# Patient Record
Sex: Female | Born: 1990 | Race: White | Hispanic: No | Marital: Single | Smoking: Former smoker
Health system: Southern US, Community
[De-identification: ages and names within clinical notes are randomized; demographics above are authoritative.]

## PROBLEM LIST (undated history)

## (undated) DIAGNOSIS — Z789 Other specified health status: Secondary | ICD-10-CM

## (undated) HISTORY — PX: TONSILLECTOMY: SUR1361

## (undated) HISTORY — PX: NO PAST SURGERIES: SHX2092

## (undated) HISTORY — DX: Other specified health status: Z78.9

---
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
## 2015-02-08 ENCOUNTER — Emergency Department (HOSPITAL_COMMUNITY)
Admission: EM | Admit: 2015-02-08 | Discharge: 2015-02-08 | Disposition: A | Discharge status: Home / Self Care | Payer: 59 | Attending: Emergency Medicine | Admitting: Emergency Medicine

## 2015-02-08 ENCOUNTER — Encounter (HOSPITAL_COMMUNITY): Payer: Self-pay

## 2015-02-08 DIAGNOSIS — Z720 Tobacco use: Secondary | ICD-10-CM | POA: Insufficient documentation

## 2015-02-08 DIAGNOSIS — Y998 Other external cause status: Secondary | ICD-10-CM | POA: Diagnosis not present

## 2015-02-08 DIAGNOSIS — Y92038 Other place in apartment as the place of occurrence of the external cause: Secondary | ICD-10-CM | POA: Insufficient documentation

## 2015-02-08 DIAGNOSIS — S301XXA Contusion of abdominal wall, initial encounter: Secondary | ICD-10-CM | POA: Diagnosis not present

## 2015-02-08 DIAGNOSIS — S3992XA Unspecified injury of lower back, initial encounter: Secondary | ICD-10-CM | POA: Diagnosis present

## 2015-02-08 DIAGNOSIS — M5418 Radiculopathy, sacral and sacrococcygeal region: Secondary | ICD-10-CM | POA: Diagnosis not present

## 2015-02-08 DIAGNOSIS — S300XXA Contusion of lower back and pelvis, initial encounter: Secondary | ICD-10-CM | POA: Insufficient documentation

## 2015-02-08 DIAGNOSIS — Y9389 Activity, other specified: Secondary | ICD-10-CM | POA: Insufficient documentation

## 2015-02-08 DIAGNOSIS — W108XXA Fall (on) (from) other stairs and steps, initial encounter: Secondary | ICD-10-CM | POA: Diagnosis not present

## 2015-02-08 DIAGNOSIS — W19XXXA Unspecified fall, initial encounter: Secondary | ICD-10-CM

## 2015-02-08 LAB — URINALYSIS, ROUTINE W REFLEX MICROSCOPIC
Bilirubin Urine: NEGATIVE
Glucose, UA: NEGATIVE mg/dL
Ketones, ur: NEGATIVE mg/dL
Nitrite: NEGATIVE
PH: 7 (ref 5.0–8.0)
PROTEIN: NEGATIVE mg/dL
SPECIFIC GRAVITY, URINE: 1.022 (ref 1.005–1.030)
Urobilinogen, UA: 0.2 mg/dL (ref 0.0–1.0)

## 2015-02-08 LAB — URINE MICROSCOPIC-ADD ON

## 2015-02-08 MED ORDER — HYDROCODONE-ACETAMINOPHEN 5-325 MG PO TABS: 2.0000 | ORAL_TABLET | ORAL | Status: DC | PRN

## 2015-02-08 MED ORDER — DOCUSATE SODIUM 100 MG PO CAPS: 100.0000 mg | ORAL_CAPSULE | Freq: Two times a day (BID) | ORAL | Status: DC

## 2015-02-08 NOTE — ED Provider Notes (Signed)
CSN: [id number]     Arrival date & time 02/08/15  [id number] History   First MD Initiated Contact with Patient 02/08/15 0350     Chief Complaint  Patient presents with  . Tailbone Pain     (Consider location/radiation/quality/duration/timing/severity/associated sxs/prior Treatment) HPI Comments: She misstepped and fell on her bottom.  2 days ago.  She now is complaining of right flank pain, as well as tailbone pain.  She's not been taking anything for discomfort.  Denies any hematuria, frequency, dysuria, nausea, vomiting, diarrhea, constipation  The history is provided by the patient.    History reviewed. No pertinent past medical history. History reviewed. No pertinent past surgical history. History reviewed. No pertinent family history. History  Substance Use Topics  . Smoking status: Current Every Day Smoker  . Smokeless tobacco: Not on file  . Alcohol Use: Yes   OB History    No data available     Review of Systems  Constitutional: Negative for fever.  Gastrointestinal: Negative for nausea, abdominal pain, diarrhea and constipation.  Genitourinary: Positive for flank pain. Negative for dysuria and hematuria.  Musculoskeletal: Negative for back pain and neck pain.  Skin: Negative for rash.  All other systems reviewed and are negative.     Allergies  Review of patient's allergies indicates no known allergies.  Home Medications   Prior to Admission medications   Medication Sig Start Date End Date Taking? Authorizing Provider  docusate sodium (COLACE) 100 MG capsule Take 1 capsule (100 mg total) by mouth every 12 (twelve) hours. 02/08/15   Arman FilterGail K Nelsie Domino, NP  HYDROcodone-acetaminophen (NORCO/VICODIN) 5-325 MG per tablet Take 2 tablets by mouth every 4 (four) hours as needed. 02/08/15   Arman FilterGail K Deveion Denz, NP   BP 133/88 mmHg  Pulse 80  Temp(Src) 98.4 F (36.9 C) (Oral)  Resp 18  Ht 5\' 8"  (1.727 m)  SpO2 99%  LMP 01/11/2015 Physical Exam  Constitutional: She appears  well-developed and well-nourished.  HENT:  Head: Normocephalic.  Eyes: Pupils are equal, round, and reactive to light.  Neck: Normal range of motion.  Cardiovascular: Normal rate and regular rhythm.   Pulmonary/Chest: Effort normal and breath sounds normal.  Abdominal: Soft. Bowel sounds are normal. She exhibits no distension.  Musculoskeletal: Normal range of motion.       Back:       Arms: Neurological: She is alert.  Skin: Skin is warm and dry. No erythema.    ED Course  Procedures (including critical care time) Labs Review Labs Reviewed  URINALYSIS, ROUTINE W REFLEX MICROSCOPIC - Abnormal; Notable for the following:    APPearance CLOUDY (*)    Hgb urine dipstick TRACE (*)    Leukocytes, UA SMALL (*)    All other components within normal limits  URINE MICROSCOPIC-ADD ON - Abnormal; Notable for the following:    Bacteria, UA FEW (*)    All other components within normal limits    Imaging Review No results found.   EKG Interpretation None     Will obtain UA due to flank pain, fall  MDM   Final diagnoses:  Radicular pain of sacrum  Fall, initial encounter  Contusion, flank, initial encounter         Arman FilterGail K Latravion Graves, NP 02/08/15 [id number]  Olivia Mackielga M Otter, MD 02/08/15 [id number]

## 2015-02-08 NOTE — ED Notes (Signed)
Pt missed a tep at her apartment yesterday and fell on her tailbone, she complains of pain in that area and now rib pain

## 2015-02-08 NOTE — Discharge Instructions (Signed)
Urine showed no sign of blood or damage to the kidney, even given prescriptions for pain control and stool softener.  Please see it on a round pillow, you can expect to be sore for several weeks

## 2015-04-27 ENCOUNTER — Encounter (HOSPITAL_BASED_OUTPATIENT_CLINIC_OR_DEPARTMENT_OTHER): Payer: Self-pay | Admitting: Emergency Medicine

## 2015-04-27 ENCOUNTER — Emergency Department (HOSPITAL_BASED_OUTPATIENT_CLINIC_OR_DEPARTMENT_OTHER)
Admission: EM | Admit: 2015-04-27 | Discharge: 2015-04-27 | Disposition: A | Discharge status: Home / Self Care | Payer: 59 | Attending: Emergency Medicine | Admitting: Emergency Medicine

## 2015-04-27 DIAGNOSIS — O209 Hemorrhage in early pregnancy, unspecified: Secondary | ICD-10-CM | POA: Insufficient documentation

## 2015-04-27 DIAGNOSIS — Z711 Person with feared health complaint in whom no diagnosis is made: Secondary | ICD-10-CM

## 2015-04-27 DIAGNOSIS — F17210 Nicotine dependence, cigarettes, uncomplicated: Secondary | ICD-10-CM | POA: Insufficient documentation

## 2015-04-27 DIAGNOSIS — O99331 Smoking (tobacco) complicating pregnancy, first trimester: Secondary | ICD-10-CM | POA: Diagnosis not present

## 2015-04-27 DIAGNOSIS — Z79899 Other long term (current) drug therapy: Secondary | ICD-10-CM | POA: Insufficient documentation

## 2015-04-27 DIAGNOSIS — Z3A01 Less than 8 weeks gestation of pregnancy: Secondary | ICD-10-CM | POA: Diagnosis not present

## 2015-04-27 LAB — CBC WITH DIFFERENTIAL/PLATELET
Basophils Absolute: 0 10*3/uL (ref 0.0–0.1)
Basophils Relative: 0 % (ref 0–1)
EOS ABS: 0.2 10*3/uL (ref 0.0–0.7)
Eosinophils Relative: 2 % (ref 0–5)
HCT: 42.8 % (ref 36.0–46.0)
Hemoglobin: 14.4 g/dL (ref 12.0–15.0)
LYMPHS ABS: 4 10*3/uL (ref 0.7–4.0)
LYMPHS PCT: 32 % (ref 12–46)
MCH: 31.7 pg (ref 26.0–34.0)
MCHC: 33.6 g/dL (ref 30.0–36.0)
MCV: 94.3 fL (ref 78.0–100.0)
Monocytes Absolute: 1.1 10*3/uL — ABNORMAL HIGH (ref 0.1–1.0)
Monocytes Relative: 9 % (ref 3–12)
NEUTROS ABS: 7.2 10*3/uL (ref 1.7–7.7)
NEUTROS PCT: 57 % (ref 43–77)
Platelets: 349 10*3/uL (ref 150–400)
RBC: 4.54 MIL/uL (ref 3.87–5.11)
RDW: 12.8 % (ref 11.5–15.5)
WBC: 12.4 10*3/uL — AB (ref 4.0–10.5)

## 2015-04-27 LAB — URINALYSIS, ROUTINE W REFLEX MICROSCOPIC
Bilirubin Urine: NEGATIVE
GLUCOSE, UA: NEGATIVE mg/dL
Ketones, ur: NEGATIVE mg/dL
Nitrite: NEGATIVE
Protein, ur: NEGATIVE mg/dL
Specific Gravity, Urine: 1.005 (ref 1.005–1.030)
Urobilinogen, UA: 0.2 mg/dL (ref 0.0–1.0)
pH: 7 (ref 5.0–8.0)

## 2015-04-27 LAB — URINE MICROSCOPIC-ADD ON

## 2015-04-27 LAB — HCG, QUANTITATIVE, PREGNANCY: hCG, Beta Chain, Quant, S: 361 m[IU]/mL — ABNORMAL HIGH (ref <5)

## 2015-04-27 LAB — ABO/RH: ABO/RH(D): A POS

## 2015-04-27 NOTE — ED Notes (Signed)
Patient states that she could possibly be 2 months pregnant. Patient has had spotting and worsening bleeding x 3 days

## 2015-04-27 NOTE — Discharge Instructions (Signed)
You were seen today for bleeding during early pregnancy. Your exam is reassuring. Your blood pregnancy test is 361 at this time. We were unable to perform an ultrasound but this likely would not show anything given your blood pregnancy tests. You need to follow-up on Saturday as advised. If you develop worsening or lateralizing pain, increased bleeding, or any new or worsening symptoms she needs to be reevaluated immediately. See return precautions below.  Miscarriage A miscarriage is the sudden loss of an unborn baby (fetus) before the 20th week of pregnancy. Most miscarriages happen in the first 3 months of pregnancy. Sometimes, it happens before a woman even knows she is pregnant. A miscarriage is also called a "spontaneous miscarriage" or "early pregnancy loss." Having a miscarriage can be an emotional experience. Talk with your caregiver about any questions you may have about miscarrying, the grieving process, and your future pregnancy plans. CAUSES   Problems with the fetal chromosomes that make it impossible for the baby to develop normally. Problems with the baby's genes or chromosomes are most often the result of errors that occur, by chance, as the embryo divides and grows. The problems are not inherited from the parents.  Infection of the cervix or uterus.   Hormone problems.   Problems with the cervix, such as having an incompetent cervix. This is when the tissue in the cervix is not strong enough to hold the pregnancy.   Problems with the uterus, such as an abnormally shaped uterus, uterine fibroids, or congenital abnormalities.   Certain medical conditions.   Smoking, drinking alcohol, or taking illegal drugs.   Trauma.  Often, the cause of a miscarriage is unknown.  SYMPTOMS   Vaginal bleeding or spotting, with or without cramps or pain.  Pain or cramping in the abdomen or lower back.  Passing fluid, tissue, or blood clots from the vagina. DIAGNOSIS  Your caregiver  will perform a physical exam. You may also have an ultrasound to confirm the miscarriage. Blood or urine tests may also be ordered. TREATMENT   Sometimes, treatment is not necessary if you naturally pass all the fetal tissue that was in the uterus. If some of the fetus or placenta remains in the body (incomplete miscarriage), tissue left behind may become infected and must be removed. Usually, a dilation and curettage (D and C) procedure is performed. During a D and C procedure, the cervix is widened (dilated) and any remaining fetal or placental tissue is gently removed from the uterus.  Antibiotic medicines are prescribed if there is an infection. Other medicines may be given to reduce the size of the uterus (contract) if there is a lot of bleeding.  If you have Rh negative blood and your baby was Rh positive, you will need a Rh immunoglobulin shot. This shot will protect any future baby from having Rh blood problems in future pregnancies. HOME CARE INSTRUCTIONS   Your caregiver may order bed rest or may allow you to continue light activity. Resume activity as directed by your caregiver.  Have someone help with home and family responsibilities during this time.   Keep track of the number of sanitary pads you use each day and how soaked (saturated) they are. Write down this information.   Do not use tampons. Do not douche or have sexual intercourse until approved by your caregiver.   Only take over-the-counter or prescription medicines for pain or discomfort as directed by your caregiver.   Do not take aspirin. Aspirin can cause bleeding.  Keep all follow-up appointments with your caregiver.   If you or your partner have problems with grieving, talk to your caregiver or seek counseling to help cope with the pregnancy loss. Allow enough time to grieve before trying to get pregnant again.  SEEK IMMEDIATE MEDICAL CARE IF:   You have severe cramps or pain in your back or  abdomen.  You have a fever.  You pass large blood clots (walnut-sized or larger) ortissue from your vagina. Save any tissue for your caregiver to inspect.   Your bleeding increases.   You have a thick, bad-smelling vaginal discharge.  You become lightheaded, weak, or you faint.   You have chills.  MAKE SURE YOU:  Understand these instructions.  Will watch your condition.  Will get help right away if you are not doing well or get worse. Document Released: 06/04/2001 Document Revised: 04/05/2013 Document Reviewed: 01/28/2012 [facility] Patient Information 2015 [place], [place]. This information is not intended to replace advice given to you by your health care provider. Make sure you discuss any questions you have with your health care provider.

## 2015-04-27 NOTE — ED Provider Notes (Signed)
CSN: [id number]     Arrival date & time 04/27/15  0149 History   First MD Initiated Contact with Patient 04/27/15 0158     Chief Complaint  Patient presents with  . Vaginal Bleeding     (Consider location/radiation/quality/duration/timing/severity/associated sxs/prior Treatment) HPI  This is a 24 year old G2 P0 (prior elective abortion) who presents with vaginal bleeding during pregnancy. Patient reports that she believes her last menstrual period was March 10. She took a urine pregnancy test on April 25 was positive. Patient states for the last 3 days she has had increasing bright red spotting. She states over the last data his pain "like a period." She reports dizziness. She also reports crampy abdominal pain that is bilateral and suprapubic. Denies any lateralizing pain. Patient does not have a primary obstetrician. She has not had an ultrasound during this pregnancy. She denies any dysuria or hematuria. Patient presented a blood donor card that showed she was A+.  History reviewed. No pertinent past medical history. History reviewed. No pertinent past surgical history. History reviewed. No pertinent family history. History  Substance Use Topics  . Smoking status: Current Every Day Smoker  . Smokeless tobacco: Not on file  . Alcohol Use: Yes     Comment: occ   OB History    No data available     Review of Systems  Constitutional: Negative for fever.  Respiratory: Negative for cough, chest tightness and shortness of breath.   Cardiovascular: Negative for chest pain.  Gastrointestinal: Negative for nausea, vomiting and abdominal pain.  Genitourinary: Positive for vaginal bleeding and pelvic pain. Negative for dysuria.  Musculoskeletal: Negative for back pain.  Neurological: Negative for headaches.  All other systems reviewed and are negative.     Allergies  Review of patient's allergies indicates no known allergies.  Home Medications   Prior to Admission medications    Medication Sig Start Date End Date Taking? Authorizing Provider  docusate sodium (COLACE) 100 MG capsule Take 1 capsule (100 mg total) by mouth every 12 (twelve) hours. 02/08/15   Earley FavorGail Schulz, NP  HYDROcodone-acetaminophen (NORCO/VICODIN) 5-325 MG per tablet Take 2 tablets by mouth every 4 (four) hours as needed. 02/08/15   Earley FavorGail Schulz, NP   BP 139/72 mmHg  Pulse 88  Temp(Src) 98.3 F (36.8 C) (Oral)  Resp 20  Ht 5\' 8"  (1.727 m)  Wt 180 lb (81.647 kg)  BMI 27.38 kg/m2  SpO2 100%  LMP 04/27/2015 Physical Exam  Constitutional: She is oriented to person, place, and time. She appears well-developed and well-nourished. No distress.  HENT:  Head: Normocephalic and atraumatic.  Cardiovascular: Normal rate, regular rhythm and normal heart sounds.   Pulmonary/Chest: Effort normal and breath sounds normal. No respiratory distress. She has no wheezes.  Abdominal: Soft. Bowel sounds are normal. There is no tenderness. There is no rebound and no guarding.  Genitourinary: Vagina normal and uterus normal.  Cervical os closed, scant vaginal bleeding noted, no adnexal tenderness, no lateralizing tenderness  Neurological: She is alert and oriented to person, place, and time.  Skin: Skin is warm and dry.  Psychiatric: She has a normal mood and affect.  Nursing note and vitals reviewed.   ED Course  Procedures (including critical care time) Labs Review Labs Reviewed  HCG, QUANTITATIVE, PREGNANCY - Abnormal; Notable for the following:    hCG, Beta Chain, Quant, S 361 (*)    All other components within normal limits  CBC WITH DIFFERENTIAL/PLATELET - Abnormal; Notable for the following:  WBC 12.4 (*)    Monocytes Absolute 1.1 (*)    All other components within normal limits  URINALYSIS, ROUTINE W REFLEX MICROSCOPIC - Abnormal; Notable for the following:    Hgb urine dipstick SMALL (*)    Leukocytes, UA TRACE (*)    All other components within normal limits  URINE MICROSCOPIC-ADD ON  ABO/RH     Imaging Review No results found.   EKG Interpretation None      MDM   Final diagnoses:  Concern about miscarriage without diagnosis  Bleeding in early pregnancy    Patient presents with vaginal bleeding during early pregnancy. Nontoxic on exam. Reports crampy abdominal pain that does not lateralize. Considerations include miscarriage and less likely ectopic pregnancy. Basic labwork obtained included beta hCG. Beta hCG is 361. Hemoglobin normal. It is unclear whether this is a rising beta hCG but seems low given last menstrual period. Most likely this is a declining beta hCG or wrong dating.  I do not have ultrasound at this time but given low beta hCG, would anticipate that US with be equivical. Patient is not lateralizing and non-peritoneal abdominal exam. Low suspicion at this time for ectopic pregnancy. Discussed with Dr. Emelda FearFerguson who recommends 48 hour follow-up and repeat beta hCG. [facility] Discussed this plan with the patient. Patient stated understanding.  If she has new or worsening symptoms including increasing abdominal pain, lateralizing abdominal pain, vaginal bleeding of more than 1 pad per hour she is to return immediately for emergent evaluation.  After history, exam, and medical workup I feel the patient has been appropriately medically screened and is safe for discharge home. Pertinent diagnoses were discussed with the patient. Patient was given return precautions.     Shon Batonourtney F Horton, MD 04/27/15 [phone]

## 2015-04-28 ENCOUNTER — Encounter (HOSPITAL_COMMUNITY): Payer: Self-pay | Admitting: Emergency Medicine

## 2015-04-28 ENCOUNTER — Emergency Department (HOSPITAL_COMMUNITY): Payer: 59

## 2015-04-28 ENCOUNTER — Emergency Department (HOSPITAL_COMMUNITY)
Admission: EM | Admit: 2015-04-28 | Discharge: 2015-04-28 | Disposition: A | Discharge status: Home / Self Care | Payer: 59 | Attending: Emergency Medicine | Admitting: Emergency Medicine

## 2015-04-28 DIAGNOSIS — Z3A01 Less than 8 weeks gestation of pregnancy: Secondary | ICD-10-CM | POA: Insufficient documentation

## 2015-04-28 DIAGNOSIS — O99331 Smoking (tobacco) complicating pregnancy, first trimester: Secondary | ICD-10-CM | POA: Insufficient documentation

## 2015-04-28 DIAGNOSIS — O209 Hemorrhage in early pregnancy, unspecified: Secondary | ICD-10-CM | POA: Diagnosis present

## 2015-04-28 DIAGNOSIS — F17200 Nicotine dependence, unspecified, uncomplicated: Secondary | ICD-10-CM | POA: Insufficient documentation

## 2015-04-28 DIAGNOSIS — Z202 Contact with and (suspected) exposure to infections with a predominantly sexual mode of transmission: Secondary | ICD-10-CM | POA: Diagnosis not present

## 2015-04-28 DIAGNOSIS — Z711 Person with feared health complaint in whom no diagnosis is made: Secondary | ICD-10-CM

## 2015-04-28 DIAGNOSIS — O039 Complete or unspecified spontaneous abortion without complication: Secondary | ICD-10-CM | POA: Insufficient documentation

## 2015-04-28 DIAGNOSIS — N939 Abnormal uterine and vaginal bleeding, unspecified: Secondary | ICD-10-CM

## 2015-04-28 LAB — HCG, QUANTITATIVE, PREGNANCY: hCG, Beta Chain, Quant, S: 123 m[IU]/mL — ABNORMAL HIGH (ref <5)

## 2015-04-28 LAB — URINALYSIS, ROUTINE W REFLEX MICROSCOPIC
BILIRUBIN URINE: NEGATIVE
GLUCOSE, UA: NEGATIVE mg/dL
KETONES UR: NEGATIVE mg/dL
Nitrite: NEGATIVE
PROTEIN: NEGATIVE mg/dL
Specific Gravity, Urine: 1.022 (ref 1.005–1.030)
UROBILINOGEN UA: 0.2 mg/dL (ref 0.0–1.0)
pH: 7 (ref 5.0–8.0)

## 2015-04-28 LAB — CBC
HEMATOCRIT: 45.4 % (ref 36.0–46.0)
Hemoglobin: 14.9 g/dL (ref 12.0–15.0)
MCH: 31.1 pg (ref 26.0–34.0)
MCHC: 32.8 g/dL (ref 30.0–36.0)
MCV: 94.8 fL (ref 78.0–100.0)
PLATELETS: 386 10*3/uL (ref 150–400)
RBC: 4.79 MIL/uL (ref 3.87–5.11)
RDW: 13 % (ref 11.5–15.5)
WBC: 14.8 10*3/uL — AB (ref 4.0–10.5)

## 2015-04-28 LAB — URINE MICROSCOPIC-ADD ON

## 2015-04-28 LAB — WET PREP, GENITAL
Clue Cells Wet Prep HPF POC: NONE SEEN
Trich, Wet Prep: NONE SEEN
YEAST WET PREP: NONE SEEN

## 2015-04-28 MED ORDER — LIDOCAINE HCL 1 % IJ SOLN
INTRAMUSCULAR | Status: AC
  Filled 2015-04-28: qty 20

## 2015-04-28 MED ORDER — AZITHROMYCIN 250 MG PO TABS
1000.0000 mg | ORAL_TABLET | Freq: Once | ORAL | Status: AC
  Administered 2015-04-28: 1000 mg via ORAL
  Filled 2015-04-28: qty 4

## 2015-04-28 MED ORDER — CEFTRIAXONE SODIUM 250 MG IJ SOLR
250.0000 mg | Freq: Once | INTRAMUSCULAR | Status: AC
  Administered 2015-04-28: 250 mg via INTRAMUSCULAR
  Filled 2015-04-28: qty 250

## 2015-04-28 NOTE — ED Provider Notes (Signed)
CSN: [id number]     Arrival date & time 04/28/15  1354 History   First MD Initiated Contact with Patient 04/28/15 1500     Chief Complaint  Patient presents with  . Vaginal Bleeding  . Abdominal Pain   Tonya Chavez is a 24 y.o. pregnant female G2P0010 who presents with vaginal bleeding and lower abdominal pain for the past 5 days. The patient was seen approximately 36 hours ago [facility] where with vaginal bleeding and mild low abdominal cramping. She had a beta hCG of 361 at that time. No ultrasound was done at that time and [facility]. She comes back to the ED today because her abdominal pain and vaginal bleeding has worsened. She complains of bilateral low abdominal pain that she rates it a 9 out of 10 and can fluctuate in intensity. She reports her pain is been waking her up. She reports her vaginal bleeding has increased from spotting to heavy menstrual cycle. She reports increased frequency of urination since last night. She is taking nothing for treatment today. She reports history of a previous elective abortion with a D&C. Patient's blood type is A positive. The patient denies fevers, nausea, vomiting, dysuria, urinary urgency, diarrhea, rashes, lightheadedness or dizziness. The patient denies history of previous abdominal surgeries.   (Consider location/radiation/quality/duration/timing/severity/associated sxs/prior Treatment) HPI  History reviewed. No pertinent past medical history. History reviewed. No pertinent past surgical history. History reviewed. No pertinent family history. History  Substance Use Topics  . Smoking status: Current Every Day Smoker  . Smokeless tobacco: Not on file  . Alcohol Use: Yes     Comment: occ   OB History    No data available     Review of Systems  Constitutional: Negative for fever and chills.  HENT: Negative for ear pain.   Eyes: Negative for pain.  Respiratory: Negative  for cough and shortness of breath.   Cardiovascular: Negative for chest pain.  Gastrointestinal: Positive for abdominal pain. Negative for nausea, vomiting, diarrhea, constipation and blood in stool.  Genitourinary: Positive for frequency and vaginal bleeding. Negative for dysuria, urgency and difficulty urinating.  Musculoskeletal: Negative for back pain and neck pain.  Skin: Negative for rash.  Neurological: Negative for dizziness, tremors, light-headedness and headaches.      Allergies  Review of patient's allergies indicates no known allergies.  Home Medications   Prior to Admission medications   Medication Sig Start Date End Date Taking? Authorizing Provider  Ibuprofen 200 MG CAPS Take 400 mg by mouth daily as needed (pain).   Yes Historical Provider, MD  docusate sodium (COLACE) 100 MG capsule Take 1 capsule (100 mg total) by mouth every 12 (twelve) hours. Patient not taking: Reported on 04/28/2015 02/08/15   Earley FavorGail Schulz, NP  HYDROcodone-acetaminophen (NORCO/VICODIN) 5-325 MG per tablet Take 2 tablets by mouth every 4 (four) hours as needed. Patient not taking: Reported on 04/28/2015 02/08/15   Earley FavorGail Schulz, NP   BP 128/67 mmHg  Pulse 79  Temp(Src) 98.6 F (37 C) (Oral)  Resp 17  SpO2 100%  LMP 04/27/2015 Physical Exam  Constitutional: She appears well-developed and well-nourished. No distress.  Nontoxic appearing.  HENT:  Head: Normocephalic and atraumatic.  Mouth/Throat: Oropharynx is clear and moist. No oropharyngeal exudate.  Eyes: Conjunctivae are normal. Pupils are equal, round, and reactive to light. Right eye exhibits no discharge. Left eye exhibits no discharge.  Neck: Neck supple.  Cardiovascular:  Normal rate, regular rhythm, normal heart sounds and intact distal pulses.  Exam reveals no gallop and no friction rub.   No murmur heard. Pulmonary/Chest: Effort normal and breath sounds normal. No respiratory distress. She has no wheezes. She has no rales.  Abdominal:  Soft. Bowel sounds are normal. She exhibits no distension and no mass. There is tenderness. There is no rebound and no guarding.  Abdomen is soft. Bowel sounds are present. There is RLQ and suprapubic tenderness to palpation. No rebound tenderness. Negative Rovsing sign. Negative psoas and obturator sign.  Genitourinary:  Pelvic exam performed by me with female nurse chaperone. No external lesions or abrasions noted. There is a small amount of blood in her vaginal vault. Cervix is closed. No cervical motion tenderness. No adnexal tenderness or fullness.  Musculoskeletal: She exhibits no edema.  Lymphadenopathy:    She has no cervical adenopathy.  Neurological: She is alert. Coordination normal.  Skin: Skin is warm and dry. No rash noted. She is not diaphoretic. No erythema. No pallor.  Psychiatric: She has a normal mood and affect. Her behavior is normal.  Nursing note and vitals reviewed.   ED Course  Procedures (including critical care time) Labs Review Labs Reviewed  WET PREP, GENITAL - Abnormal; Notable for the following:    WBC, Wet Prep HPF POC FEW (*)    All other components within normal limits  CBC - Abnormal; Notable for the following:    WBC 14.8 (*)    All other components within normal limits  HCG, QUANTITATIVE, PREGNANCY - Abnormal; Notable for the following:    hCG, Beta Chain, Quant, S 123 (*)    All other components within normal limits  URINALYSIS, ROUTINE W REFLEX MICROSCOPIC - Abnormal; Notable for the following:    APPearance CLOUDY (*)    Hgb urine dipstick LARGE (*)    Leukocytes, UA SMALL (*)    All other components within normal limits  URINE MICROSCOPIC-ADD ON  GC/CHLAMYDIA PROBE AMP ([place])    Imaging Review US Ob Comp Less 14 Wks  04/28/2015   CLINICAL DATA:  Vaginal bleeding for 4 days. Vaginal bleeding is heavy with clots today. Beta HCG level is 123. Patient is 8 weeks and 1 day pregnant based on her last menstrual period.  EXAM: OBSTETRIC <14  WK [place] AND TRANSVAGINAL OB US  TECHNIQUE: Both transabdominal and transvaginal ultrasound examinations were performed for complete evaluation of the gestation as well as the maternal uterus, adnexal regions, and pelvic cul-de-sac. Transvaginal technique was performed to assess early pregnancy.  COMPARISON:  None.  FINDINGS: Intrauterine gestational sac: None  Yolk sac:  No  Embryo:  No  Cardiac Activity: No  Maternal uterus/adnexae: No uterine masses. Endometrium 12 mm in thickness. Endometrium is mildly heterogeneous. No endometrial fluid.  Normal ovaries.  No adnexal masses.  No pelvic free fluid.  IMPRESSION: 1. No evidence of an intrauterine or ectopic pregnancy. 2. Findings may be due to a completed miscarriage. An early intrauterine pregnancy is possible. Ectopic pregnancy is not excluded. Recommend follow-up beta HCG level. It beta HCG level is increasing, repeat ultrasound would be indicated. 3. No uterine masses.  Normal ovaries and adnexa.   Electronically Signed   By: Amie Portland M.D.   On: 04/28/2015 17:03   US Ob Transvaginal  04/28/2015   CLINICAL DATA:  Vaginal bleeding for 4 days. Vaginal bleeding is heavy with clots today. Beta HCG level is 123. Patient is 8 weeks and 1 day pregnant  based on her last menstrual period.  EXAM: OBSTETRIC <14 WK US AND TRANSVAGINAL OB US  TECHNIQUE: Both transabdominal and transvaginal ultrasound examinations were performed for complete evaluation of the gestation as well as the maternal uterus, adnexal regions, and pelvic cul-de-sac. Transvaginal technique was performed to assess early pregnancy.  COMPARISON:  None.  FINDINGS: Intrauterine gestational sac: None  Yolk sac:  No  Embryo:  No  Cardiac Activity: No  Maternal uterus/adnexae: No uterine masses. Endometrium 12 mm in thickness. Endometrium is mildly heterogeneous. No endometrial fluid.  Normal ovaries.  No adnexal masses.  No pelvic free fluid.  IMPRESSION: 1. No evidence of an intrauterine or ectopic  pregnancy. 2. Findings may be due to a completed miscarriage. An early intrauterine pregnancy is possible. Ectopic pregnancy is not excluded. Recommend follow-up beta HCG level. It beta HCG level is increasing, repeat ultrasound would be indicated. 3. No uterine masses.  Normal ovaries and adnexa.   Electronically Signed   By: Amie Portlandavid  Ormond M.D.   On: 04/28/2015 17:03     EKG Interpretation None      Filed Vitals:   04/28/15 1419 04/28/15 1800 04/28/15 2016  BP: 116/72 108/61 128/67  Pulse: 86 75 79  Temp: 98.3 F (36.8 C) 98.7 F (37.1 C) 98.6 F (37 C)  TempSrc: Oral    Resp: 16 16 17   SpO2: 99% 100% 100%     MDM   Meds given in ED:  Medications  lidocaine (XYLOCAINE) 1 % (with pres) injection (not administered)  cefTRIAXone (ROCEPHIN) injection 250 mg (250 mg Intramuscular Given 04/28/15 2030)  azithromycin (ZITHROMAX) tablet 1,000 mg (1,000 mg Oral Given 04/28/15 2030)    Discharge Medication List as of 04/28/2015  8:10 PM      Final diagnoses:  Vaginal bleeding  Miscarriage  Concern about STD in female without diagnosis   This is a 24 y.o. pregnant female G2P0010 who presents with vaginal bleeding and lower abdominal pain for the past 5 days. The patient was seen approximately 36 hours ago [facility] where with vaginal bleeding and mild low abdominal cramping. She had a beta hCG of 361 at that time. No ultrasound was done at that time and [facility]. She comes back to the ED today because her abdominal pain and vaginal bleeding has worsened.  On exam the patient is afebrile and nontoxic appearing. She has suprapubic and right lower quadrant tenderness palpation without peritoneal signs. Will recheck beta hCG, blood work, pelvic exam and perform ultrasound. The patient's beta hCG is 123. This is a decrease from 361 earlier. The patient's ultrasound indicated no evidence of intrauterine or ectopic pregnancy.  Findings may be due to a completed miscarriage. This seems consistent with a lowering HCG. urinalysis indicated small leukocytes. At reevaluation the patient reports that her abdominal pain resolved after her ultrasound. She reports feeling much better. Pelvic exam performed by me and tolerated well by the patient. No cervical motion tenderness or adnexal tenderness or fullness. There is a small amount of blood in her vaginal vault with a closed cervix. Gonorrhea, chlamydia and wet prep sent for analysis. Wet prep returned wiith few white blood cells. After discussing with my attending will treat prophylactically for gonorrhea and chlamydia with Rocephin and azithromycin in the ED. Patient is in agreement with this plan. The patient feels ready to be discharge. We'll discharge and have her keep her follow-up appointment with women's  outpatient clinic for tomorrow at 9 AM. I advised the patient to follow-up with their primary care provider this week. I advised the patient to return to the emergency department with new or worsening symptoms or new concerns. The patient verbalized understanding and agreement with plan.   This patient was discussed with Dr. Rosalia Hammers who agrees with assessment and plan.    Everlene Farrier, PA-C 04/28/15 2043  Margarita Grizzle, MD 05/02/15 [phone]

## 2015-04-28 NOTE — ED Notes (Signed)
Patient transported to Ultrasound 

## 2015-04-28 NOTE — ED Notes (Signed)
LAB DRAWN BY Darlyn ChamberREVOR DEAN [phone]

## 2015-04-28 NOTE — ED Notes (Signed)
Pt reports increased vaginal bleeding since last night with pain on R groin. Was seen at [facility] last night for similar symptoms, but symptom severity has increased. Pt is pregnant.

## 2015-04-28 NOTE — Discharge Instructions (Signed)
Miscarriage °A miscarriage is the sudden loss of an unborn baby (fetus) before the 20th week of pregnancy. Most miscarriages happen in the first 3 months of pregnancy. Sometimes, it happens before a woman even knows she is pregnant. A miscarriage is also called a "spontaneous miscarriage" or "early pregnancy loss." Having a miscarriage can be an emotional experience. Talk with your caregiver about any questions you may have about miscarrying, the grieving process, and your future pregnancy plans. °CAUSES  °· Problems with the fetal chromosomes that make it impossible for the baby to develop normally. Problems with the baby's genes or chromosomes are most often the result of errors that occur, by chance, as the embryo divides and grows. The problems are not inherited from the parents. °· Infection of the cervix or uterus.   °· Hormone problems.   °· Problems with the cervix, such as having an incompetent cervix. This is when the tissue in the cervix is not strong enough to hold the pregnancy.   °· Problems with the uterus, such as an abnormally shaped uterus, uterine fibroids, or congenital abnormalities.   °· Certain medical conditions.   °· Smoking, drinking alcohol, or taking illegal drugs.   °· Trauma.   °Often, the cause of a miscarriage is unknown.  °SYMPTOMS  °· Vaginal bleeding or spotting, with or without cramps or pain. °· Pain or cramping in the abdomen or lower back. °· Passing fluid, tissue, or blood clots from the vagina. °DIAGNOSIS  °Your caregiver will perform a physical exam. You may also have an ultrasound to confirm the miscarriage. Blood or urine tests may also be ordered. °TREATMENT  °· Sometimes, treatment is not necessary if you naturally pass all the fetal tissue that was in the uterus. If some of the fetus or placenta remains in the body (incomplete miscarriage), tissue left behind may become infected and must be removed. Usually, a dilation and curettage (D and C) procedure is performed.  During a D and C procedure, the cervix is widened (dilated) and any remaining fetal or placental tissue is gently removed from the uterus. °· Antibiotic medicines are prescribed if there is an infection. Other medicines may be given to reduce the size of the uterus (contract) if there is a lot of bleeding. °· If you have Rh negative blood and your baby was Rh positive, you will need a Rh immunoglobulin shot. This shot will protect any future baby from having Rh blood problems in future pregnancies. °HOME CARE INSTRUCTIONS  °· Your caregiver may order bed rest or may allow you to continue light activity. Resume activity as directed by your caregiver. °· Have someone help with home and family responsibilities during this time.   °· Keep track of the number of sanitary pads you use each day and how soaked (saturated) they are. Write down this information.   °· Do not use tampons. Do not douche or have sexual intercourse until approved by your caregiver.   °· Only take over-the-counter or prescription medicines for pain or discomfort as directed by your caregiver.   °· Do not take aspirin. Aspirin can cause bleeding.   °· Keep all follow-up appointments with your caregiver.   °· If you or your partner have problems with grieving, talk to your caregiver or seek counseling to help cope with the pregnancy loss. Allow enough time to grieve before trying to get pregnant again.   °SEEK IMMEDIATE MEDICAL CARE IF:  °· You have severe cramps or pain in your back or abdomen. °· You have a fever. °· You pass large blood clots (walnut-sized   or larger) ortissue from your vagina. Save any tissue for your caregiver to inspect.   Your bleeding increases.   You have a thick, bad-smelling vaginal discharge.  You become lightheaded, weak, or you faint.   You have chills.  MAKE SURE YOU:  Understand these instructions.  Will watch your condition.  Will get help right away if you are not doing well or get  worse. Document Released: 06/04/2001 Document Revised: 04/05/2013 Document Reviewed: 01/28/2012 [facility] Patient Information 2015 [place], [place]. This information is not intended to replace advice given to you by your health care provider. Make sure you discuss any questions you have with your health care provider. Possible Sexually Transmitted Disease A sexually transmitted disease (STD) is a disease or infection that may be passed (transmitted) from person to person, usually during sexual activity. This may happen by way of saliva, semen, blood, vaginal mucus, or urine. Common STDs include:   Gonorrhea.   Chlamydia.   Syphilis.   HIV and AIDS.   Genital herpes.   Hepatitis B and C.   Trichomonas.   Human papillomavirus (HPV).   Pubic lice.   Scabies.  Mites.  Bacterial vaginosis. WHAT ARE CAUSES OF STDs? An STD may be caused by bacteria, a virus, or parasites. STDs are often transmitted during sexual activity if one person is infected. However, they may also be transmitted through nonsexual means. STDs may be transmitted after:   Sexual intercourse with an infected person.   Sharing sex toys with an infected person.   Sharing needles with an infected person or using unclean piercing or tattoo needles.  Having intimate contact with the genitals, mouth, or rectal areas of an infected person.   Exposure to infected fluids during birth. WHAT ARE THE SIGNS AND SYMPTOMS OF STDs? Different STDs have different symptoms. Some people may not have any symptoms. If symptoms are present, they may include:   Painful or bloody urination.   Pain in the pelvis, abdomen, vagina, anus, throat, or eyes.   A skin rash, itching, or irritation.  Growths, ulcerations, blisters, or sores in the genital and anal areas.  Abnormal vaginal discharge with or without bad odor.   Penile discharge in men.   Fever.   Pain or bleeding during sexual intercourse.   Swollen  glands in the groin area.   Yellow skin and eyes (jaundice). This is seen with hepatitis.   Swollen testicles.  Infertility.  Sores and blisters in the mouth. HOW ARE STDs DIAGNOSED? To make a diagnosis, your health care provider may:   Take a medical history.   Perform a physical exam.   Take a sample of any discharge to examine.  Swab the throat, cervix, opening to the penis, rectum, or vagina for testing.  Test a sample of your first morning urine.   Perform blood tests.   Perform a Pap test, if this applies.   Perform a colposcopy.   Perform a laparoscopy.  HOW ARE STDs TREATED? Treatment depends on the STD. Some STDs may be treated but not cured.   Chlamydia, gonorrhea, trichomonas, and syphilis can be cured with antibiotic medicine.   Genital herpes, hepatitis, and HIV can be treated, but not cured, with prescribed medicines. The medicines lessen symptoms.   Genital warts from HPV can be treated with medicine or by freezing, burning (electrocautery), or surgery. Warts may come back.   HPV cannot be cured with medicine or surgery. However, abnormal areas may be removed from the cervix, vagina, or vulva.  If your diagnosis is confirmed, your recent sexual partners need treatment. This is true even if they are symptom-free or have a negative culture or evaluation. They should not have sex until their health care providers say it is okay. HOW CAN I REDUCE MY RISK OF GETTING AN STD? Take these steps to reduce your risk of getting an STD:  Use latex condoms, dental dams, and water-soluble lubricants during sexual activity. Do not use petroleum jelly or oils.  Avoid having multiple sex partners.  Do not have sex with someone who has other sex partners.  Do not have sex with anyone you do not know or who is at high risk for an STD.  Avoid risky sex practices that can break your skin.  Do not have sex if you have open sores on your mouth or  skin.  Avoid drinking too much alcohol or taking illegal drugs. Alcohol and drugs can affect your judgment and put you in a vulnerable position.  Avoid engaging in oral and anal sex acts.  Get vaccinated for HPV and hepatitis. If you have not received these vaccines in the past, talk to your health care provider about whether one or both might be right for you.   If you are at risk of being infected with HIV, it is recommended that you take a prescription medicine daily to prevent HIV infection. This is called pre-exposure prophylaxis (PrEP). You are considered at risk if:  You are a man who has sex with other men (MSM).  You are a heterosexual man or woman and are sexually active with more than one partner.  You take drugs by injection.  You are sexually active with a partner who has HIV.  Talk with your health care provider about whether you are at high risk of being infected with HIV. If you choose to begin PrEP, you should first be tested for HIV. You should then be tested every 3 months for as long as you are taking PrEP.  WHAT SHOULD I DO IF I THINK I HAVE AN STD?  See your health care provider.   Tell your sexual partner(s). They should be tested and treated for any STDs.  Do not have sex until your health care provider says it is okay. WHEN SHOULD I GET IMMEDIATE MEDICAL CARE? Contact your health care provider right away if:   You have severe abdominal pain.  You are a man and notice swelling or pain in your testicles.  You are a woman and notice swelling or pain in your vagina. Document Released: 03/01/2003 Document Revised: 12/14/2013 Document Reviewed: 06/29/2013 [facility] Patient Information 2015 [place], [place]. This information is not intended to replace advice given to you by your health care provider. Make sure you discuss any questions you have with your health care provider.

## 2015-04-29 ENCOUNTER — Inpatient Hospital Stay (HOSPITAL_COMMUNITY)
Admission: AD | Admit: 2015-04-29 | Discharge: 2015-04-29 | Disposition: A | Discharge status: Home / Self Care | Payer: 59 | Source: Ambulatory Visit | Attending: Obstetrics and Gynecology | Admitting: Obstetrics and Gynecology

## 2015-04-29 ENCOUNTER — Encounter (HOSPITAL_COMMUNITY): Payer: Self-pay

## 2015-04-29 DIAGNOSIS — Z3A08 8 weeks gestation of pregnancy: Secondary | ICD-10-CM | POA: Insufficient documentation

## 2015-04-29 DIAGNOSIS — O039 Complete or unspecified spontaneous abortion without complication: Secondary | ICD-10-CM | POA: Diagnosis not present

## 2015-04-29 DIAGNOSIS — O9989 Other specified diseases and conditions complicating pregnancy, childbirth and the puerperium: Secondary | ICD-10-CM | POA: Diagnosis present

## 2015-04-29 LAB — HCG, QUANTITATIVE, PREGNANCY: HCG, BETA CHAIN, QUANT, S: 47 m[IU]/mL — AB (ref <5)

## 2015-04-29 NOTE — MAU Provider Note (Signed)
Ms. Tonya Chavez  is a 24 y.o. G1P0 at 3978w2d who presents to MAU today for follow-up quant hCG. [facility] and had quant hCG of 361, US was not performed. Patient was having bleeding. [facility] and had quant hCG of 123. Patient was instructed to follow-up with [facility], but presents today for follow-up. She states continued bleeding, although improved from recently. She states 5/10 lower abdominal pain now more in the LLQ than RLQ.    BP 117/72 mmHg  Pulse 83  Temp(Src) 97.8 F (36.6 C) (Oral)  Resp 16  Ht 5\' 8"  (1.727 m)  Wt 195 lb 2 oz (88.508 kg)  BMI 29.68 kg/m2  LMP 03/02/2015  CONSTITUTIONAL: Well-developed, well-nourished female in no acute distress.  ENT: External right and left ear normal.  EYES: EOM intact, conjunctivae normal.  MUSCULOSKELETAL: Normal range of motion.  CARDIOVASCULAR: Regular heart rate RESPIRATORY: Normal effort NEUROLOGICAL: Alert and oriented to person, place, and time.  SKIN: Skin is warm and dry. No rash noted. Not diaphoretic. No erythema. No pallor. PSYCH: Normal mood and affect. Normal behavior. Normal judgment and thought content.  Results for Tonya GipIERCE, Veera (MRN [id number]) as of 04/29/2015 11:42  Ref. Range 04/27/2015 02:18 04/27/2015 02:57 04/28/2015 14:35 04/28/2015 15:04 04/28/2015 16:56 04/28/2015 19:20 04/29/2015 09:19  HCG, Beta Chain, Quant, S Latest Ref Range: <5 mIU/mL 361 (H)  123 (H)    47 (H)    A: SAB  P: Discharge home Bleeding precautions discussed Patient will follow-up with [facility] in ~ 2 weeks. They will call her with an appointment.  Patient may return to MAU as needed or if her condition were to change or worsen   Marny LowensteinJulie N Daisie Haft, PA-C 04/29/2015 10:06 AM

## 2015-04-29 NOTE — MAU Note (Signed)
Pt states here for repeat BHCG. BHCG levels went down. Still bleeding now, no clots noted today. Pain in RLQ, now moving to left side as well.

## 2015-04-29 NOTE — Discharge Instructions (Signed)
Incomplete Miscarriage A miscarriage is the sudden loss of an unborn baby (fetus) before the 20th week of pregnancy. In an incomplete miscarriage, parts of the fetus or placenta (afterbirth) remain in the body.  Having a miscarriage can be an emotional experience. Talk with your health care provider about any questions you may have about miscarrying, the grieving process, and your future pregnancy plans. CAUSES   Problems with the fetal chromosomes that make it impossible for the baby to develop normally. Problems with the baby's genes or chromosomes are most often the result of errors that occur by chance as the embryo divides and grows. The problems are not inherited from the parents.  Infection of the cervix or uterus.  Hormone problems.  Problems with the cervix, such as having an incompetent cervix. This is when the tissue in the cervix is not strong enough to hold the pregnancy.  Problems with the uterus, such as an abnormally shaped uterus, uterine fibroids, or congenital abnormalities.  Certain medical conditions.  Smoking, drinking alcohol, or taking illegal drugs.  Trauma. SYMPTOMS   Vaginal bleeding or spotting, with or without cramps or pain.  Pain or cramping in the abdomen or lower back.  Passing fluid, tissue, or blood clots from the vagina. DIAGNOSIS  Your health care provider will perform a physical exam. You may also have an ultrasound to confirm the miscarriage. Blood or urine tests may also be ordered. TREATMENT   Usually, a dilation and curettage (D&C) procedure is performed. During a D&C procedure, the cervix is widened (dilated) and any remaining fetal or placental tissue is gently removed from the uterus.  Antibiotic medicines are prescribed if there is an infection. Other medicines may be given to reduce the size of the uterus (contract) if there is a lot of bleeding.  If you have Rh negative blood and your baby was Rh positive, you will need a Rho (D)  immune globulin shot. This shot will protect any future baby from having Rh blood problems in future pregnancies.  You may be confined to bed rest. This means you should stay in bed and only get up to use the bathroom. HOME CARE INSTRUCTIONS   Rest as directed by your health care provider.  Restrict activity as directed by your health care provider. You may be allowed to continue light activity if curettage was not done but you require further treatment.  Keep track of the number of pads you use each day. Keep track of how soaked (saturated) they are. Record this information.  Do not  use tampons.  Do not douche or have sexual intercourse until approved by your health care provider.  Keep all follow-up appointments for reevaluation and continuing management.  Only take over-the-counter or prescription medicines for pain, fever, or discomfort as directed by your health care provider.  Take antibiotic medicine as directed by your health care provider. Make sure you finish it even if you start to feel better. SEEK IMMEDIATE MEDICAL CARE IF:   You experience severe cramps in your stomach, back, or abdomen.  You have an unexplained temperature (make sure to record these temperatures).  You pass large clots or tissue (save these for your health care provider to inspect).  Your bleeding increases.  You become light-headed, weak, or have fainting episodes. MAKE SURE YOU:   Understand these instructions.  Will watch your condition.  Will get help right away if you are not doing well or get worse. Document Released: 12/09/2005 Document Revised: 04/25/2014 Document Reviewed:   07/08/2013 ExitCare Patient Information 2015 [employer]. This information is not intended to replace advice given to you by your health care provider. Make sure you discuss any questions you have with your health care provider.  

## 2015-05-01 LAB — GC/CHLAMYDIA PROBE AMP (~~LOC~~) NOT AT ARMC
CHLAMYDIA, DNA PROBE: POSITIVE — AB
Neisseria Gonorrhea: NEGATIVE

## 2015-05-03 ENCOUNTER — Telehealth (HOSPITAL_BASED_OUTPATIENT_CLINIC_OR_DEPARTMENT_OTHER): Payer: Self-pay | Admitting: Emergency Medicine

## 2015-05-03 NOTE — Telephone Encounter (Signed)
Notified + chalmydia. Educated re abstinence and notification of sexual partner(s)

## 2015-05-18 ENCOUNTER — Ambulatory Visit: Payer: 59 | Admitting: Family Medicine

## 2015-12-07 ENCOUNTER — Encounter: Payer: Self-pay | Admitting: Physician Assistant

## 2015-12-07 ENCOUNTER — Ambulatory Visit (INDEPENDENT_AMBULATORY_CARE_PROVIDER_SITE_OTHER): Payer: 59 | Admitting: Physician Assistant

## 2015-12-07 VITALS — BP 130/71 | HR 84 | Temp 98.2°F | Resp 18 | Ht 68.0 in | Wt 190.0 lb

## 2015-12-07 DIAGNOSIS — Z202 Contact with and (suspected) exposure to infections with a predominantly sexual mode of transmission: Secondary | ICD-10-CM | POA: Diagnosis not present

## 2015-12-07 LAB — POCT WET + KOH PREP
Trich by wet prep: ABSENT
Yeast by KOH: ABSENT
Yeast by wet prep: ABSENT

## 2015-12-07 MED ORDER — CEFTRIAXONE SODIUM 1 G IJ SOLR
250.0000 mg | Freq: Once | INTRAMUSCULAR | Status: AC
  Administered 2015-12-07: 250 mg via INTRAMUSCULAR

## 2015-12-07 MED ORDER — AZITHROMYCIN 250 MG PO TABS
1000.0000 mg | ORAL_TABLET | Freq: Once | ORAL | Status: DC
Start: 2015-12-07 — End: 2017-01-07

## 2015-12-07 NOTE — Progress Notes (Signed)
[facility] [address], [place] [address] [phone]  Date:  12/07/2015   Name:  Myles Gipshton Reetz   DOB:  11/17/1991   MRN:  [id number]  PCP:  No PCP Per Patient    History of Present Illness:  Myles Gipshton Reister is a 24 y.o. female patient who [facility] cc of std exposure.  She received contact from former relationship partner, that he was dxd with an std.  He would not formally state what std it was.  She has noticed more discharge, though she has not been with this partner for months.  She denies dysuria, hematuria, frequency.  No malodorous vaginal odor, pruritus, or abdominal pain.   No recent illnesses.    There are no active problems to display for this patient.   History reviewed. No pertinent past medical history.  History reviewed. No pertinent past surgical history.  Social History  Substance Use Topics  . Smoking status: Current Every Day Smoker  . Smokeless tobacco: None  . Alcohol Use: Yes     Comment: occ    Family History  Problem Relation Age of Onset  . Hyperlipidemia Father   . Stroke Maternal Grandfather   . Cancer Paternal Grandmother   . Hyperlipidemia Paternal Grandmother     No Known Allergies  Medication list has been reviewed and updated.  Current Outpatient Prescriptions on File Prior to Visit  Medication Sig Dispense Refill  . docusate sodium (COLACE) 100 MG capsule Take 1 capsule (100 mg total) by mouth every 12 (twelve) hours. (Patient not taking: Reported on 04/28/2015) 60 capsule 0  . HYDROcodone-acetaminophen (NORCO/VICODIN) 5-325 MG per tablet Take 2 tablets by mouth every 4 (four) hours as needed. (Patient not taking: Reported on 04/28/2015) 10 tablet 0  . Ibuprofen 200 MG CAPS Take 400 mg by mouth daily as needed (pain). Reported on 12/07/2015     No current facility-administered medications on file prior to visit.    ROS ROS otherwise unremarkable unless listed above.   Physical Examination: BP  130/71 mmHg  Pulse 84  Temp(Src) 98.2 F (36.8 C) (Oral)  Resp 18  Ht 5\' 8"  (1.727 m)  Wt 190 lb (86.183 kg)  BMI 28.90 kg/m2  SpO2 98%  LMP 11/16/2015 Ideal Body Weight: Weight in (lb) to have BMI = 25: 164.1  Physical Exam  Constitutional: She is oriented to person, place, and time. She appears well-developed and well-nourished. No distress.  HENT:  Head: Normocephalic and atraumatic.  Right Ear: External ear normal.  Left Ear: External ear normal.  Eyes: Conjunctivae and EOM are normal. Pupils are equal, round, and reactive to light.  Cardiovascular: Normal rate.   Pulmonary/Chest: Effort normal. No respiratory distress.  Genitourinary: Pelvic exam was performed with patient supine. There is no rash on the right labia. There is no rash on the left labia. Cervix exhibits discharge and friability. Cervix exhibits no motion tenderness. Vaginal discharge found.  Neurological: She is alert and oriented to person, place, and time.  Skin: She is not diaphoretic.  Psychiatric: She has a normal mood and affect. Her behavior is normal.   Results for orders placed or performed in visit on 12/07/15  GC/Chlamydia Probe Amp  Result Value Ref Range   CT Probe RNA NOT DETECTED    GC Probe RNA NOT DETECTED   HIV antibody  Result Value Ref Range   HIV 1&2 Ab, 4th Generation NONREACTIVE NONREACTIVE  RPR  Result Value Ref Range  RPR Ser Ql NON REAC NON REAC  POCT Wet + KOH Prep  Result Value Ref Range   Yeast by KOH Absent Present, Absent   Yeast by wet prep Absent Present, Absent   WBC by wet prep Moderate (A) None, Few, Too numerous to count   Clue Cells Wet Prep HPF POC Few (A) None, Too numerous to count   Trich by wet prep Absent Present, Absent   Bacteria Wet Prep HPF POC Few None, Few, Too numerous to count   Epithelial Cells By [employer] ([facility]) Moderate (A) None, Few, Too numerous to count   RBC,UR,HPF,POC None None RBC/hpf     Assessment and Plan: Rudene Poulsen is a 24 y.o.  female who is here today for cc of std exposure.   Treated presumptively for gonorrhea and chlamydia. Will follow up with additional treatment if needed.   Will treat bv if labs are normal.    STD exposure - Plan: azithromycin (ZITHROMAX) 250 MG tablet, cefTRIAXone (ROCEPHIN) injection 250 mg, POCT Wet + KOH Prep, HIV antibody, RPR, GC/Chlamydia Probe Amp  Trena Platt, PA-C [facility] 12/07/2015 5:41 PM  Addendum: contacted patient of negative results 4 days later.  Started on metronidazole to treat possible bv

## 2015-12-07 NOTE — Patient Instructions (Signed)
I will contact you with the results of your labs as soon as I get them.   Please take the azithromycin all at once as prescribed.

## 2015-12-08 LAB — GC/CHLAMYDIA PROBE AMP
CT PROBE, AMP APTIMA: NOT DETECTED
GC Probe RNA: NOT DETECTED

## 2015-12-08 LAB — HIV ANTIBODY (ROUTINE TESTING W REFLEX): HIV 1&2 Ab, 4th Generation: NONREACTIVE

## 2015-12-08 LAB — RPR

## 2015-12-12 ENCOUNTER — Other Ambulatory Visit: Payer: Self-pay | Admitting: Physician Assistant

## 2015-12-12 DIAGNOSIS — B9689 Other specified bacterial agents as the cause of diseases classified elsewhere: Secondary | ICD-10-CM

## 2015-12-12 DIAGNOSIS — N760 Acute vaginitis: Principal | ICD-10-CM

## 2015-12-12 MED ORDER — METRONIDAZOLE 500 MG PO TABS: 500.0000 mg | ORAL_TABLET | Freq: Two times a day (BID) | ORAL | Status: DC

## 2015-12-12 NOTE — Progress Notes (Signed)
Discussed std lab results--normal.  We will proceed to the metronidazole to treat the BV as previously discussed.  Advised avoidance of alcohol at this time.

## 2016-04-19 IMAGING — US US OB TRANSVAGINAL
1 series · 14 of 28 positions shown · non-contrast
Comparison: None.

CLINICAL DATA: Vaginal bleeding for 4 days. Vaginal bleeding is
and 1 day pregnant based on her last menstrual period.

EXAM:
OBSTETRIC <14 WK US AND TRANSVAGINAL OB US
TECHNIQUE: Both transabdominal and transvaginal ultrasound examinations were
performed for complete evaluation of the gestation as well as the
maternal uterus, adnexal regions, and pelvic cul-de-sac.
Transvaginal technique was performed to assess early pregnancy.

[Series 1: us ob transvaginal · 0.24mm/px · 43 acquisitions, 14 frames shown]
[im 2/43]
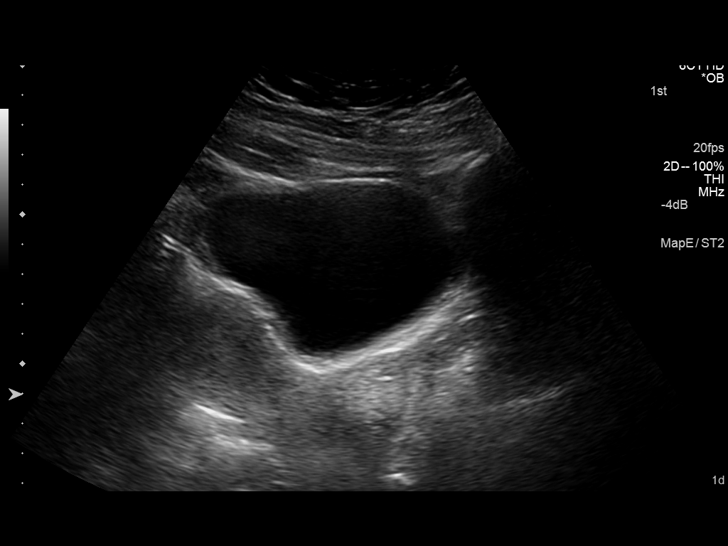
[im 5/43]
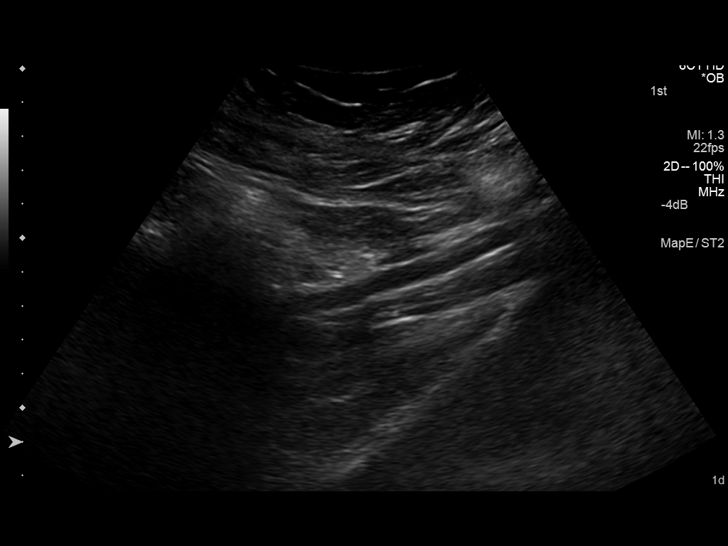
[im 8/43]
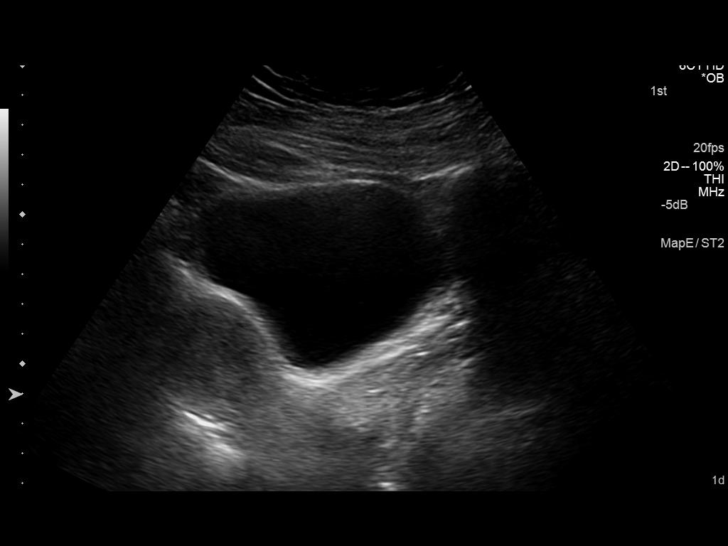
[im 11/43]
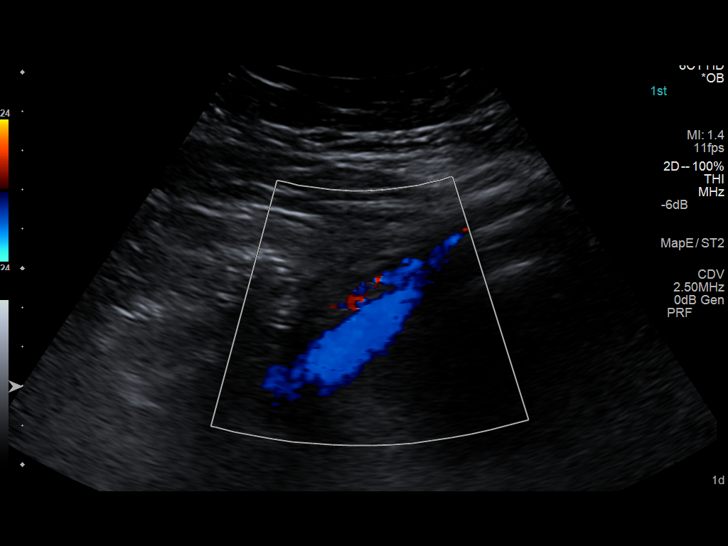
[im 15/43]
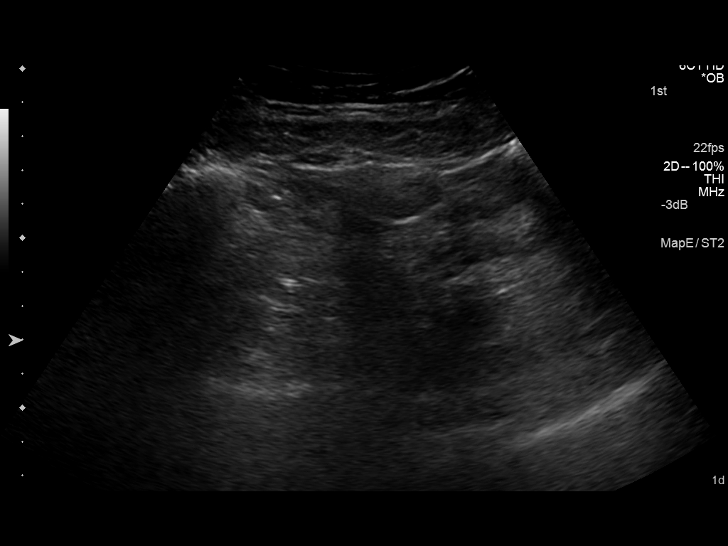
[im 18/43]
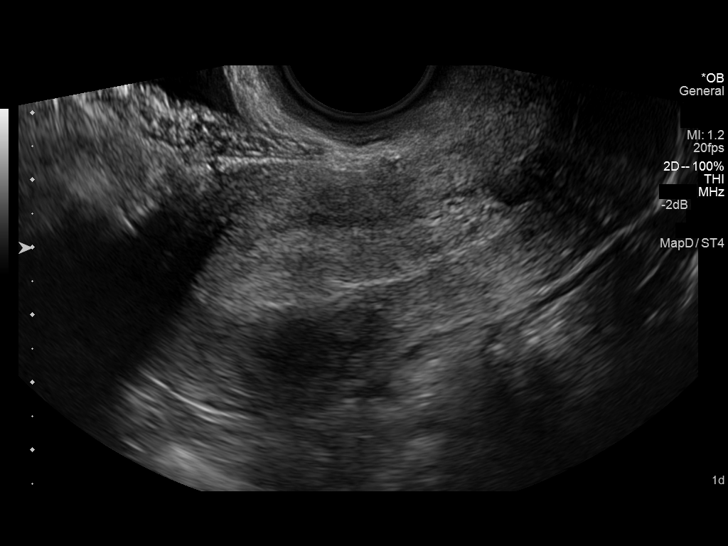
[im 21/43]
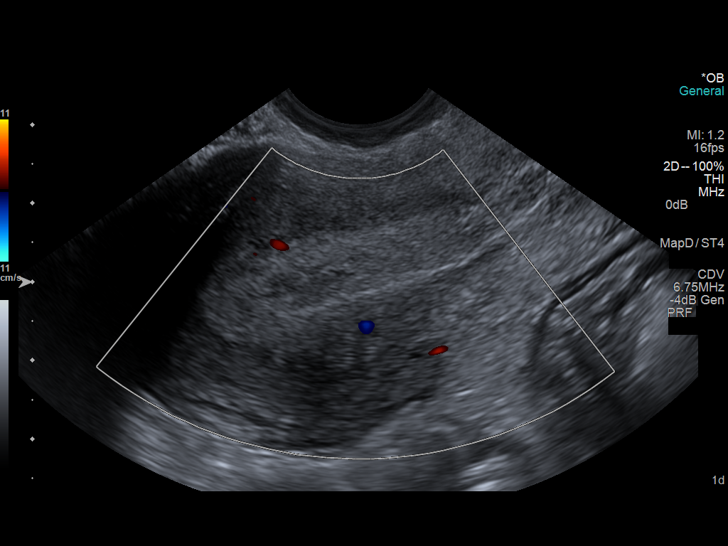
[im 24/43]
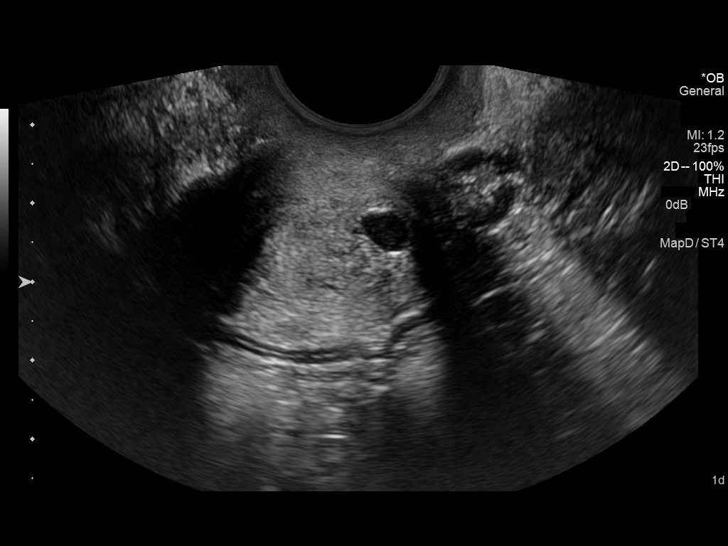
[im 27/43]
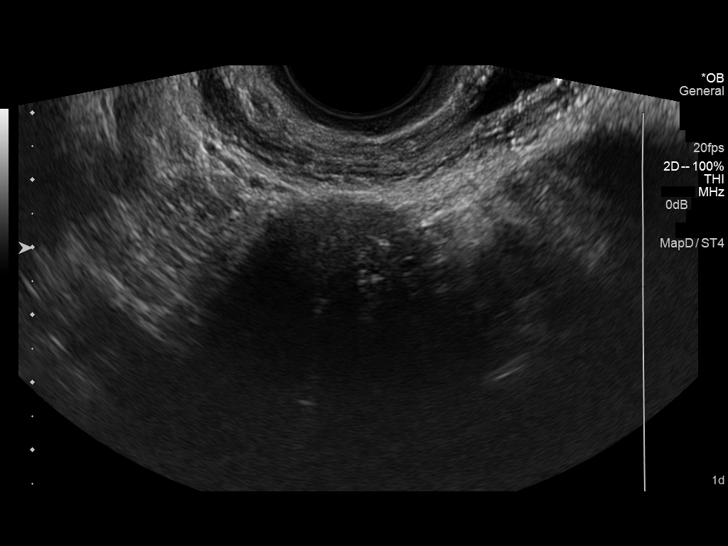
[im 30/43]
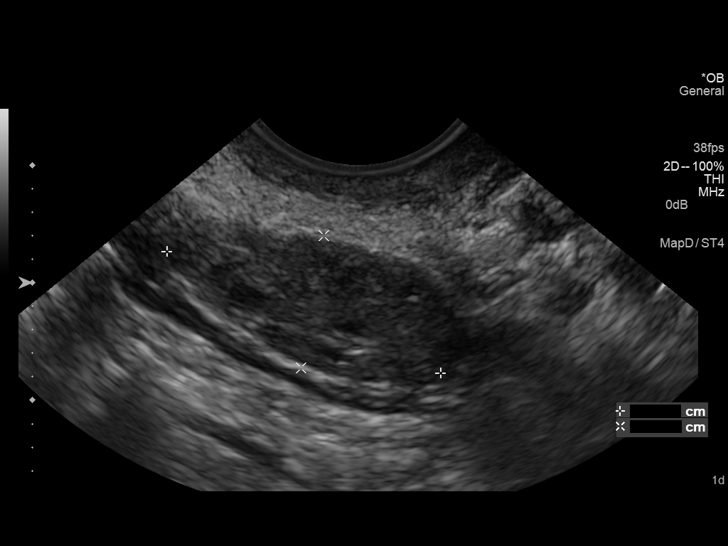
[im 33/43]
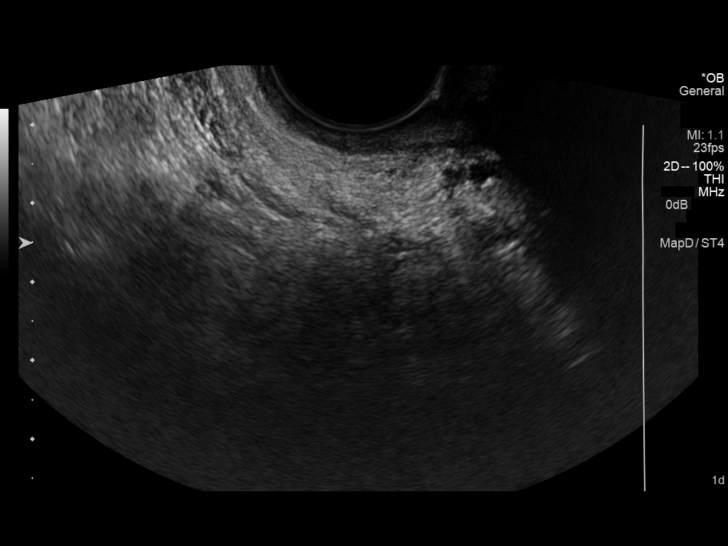
[im 36/43]
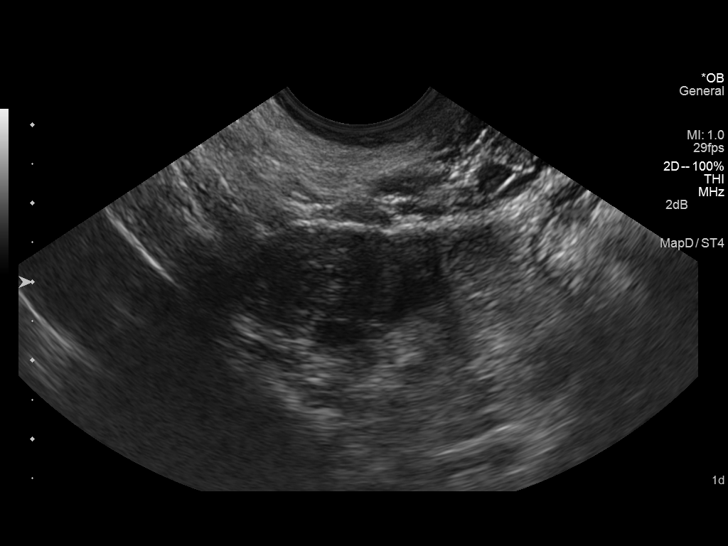
[im 39/43]
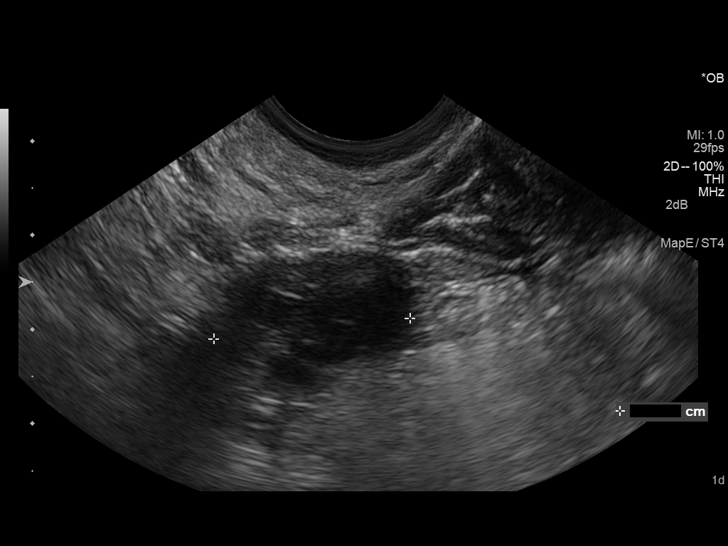
[im 43/43]
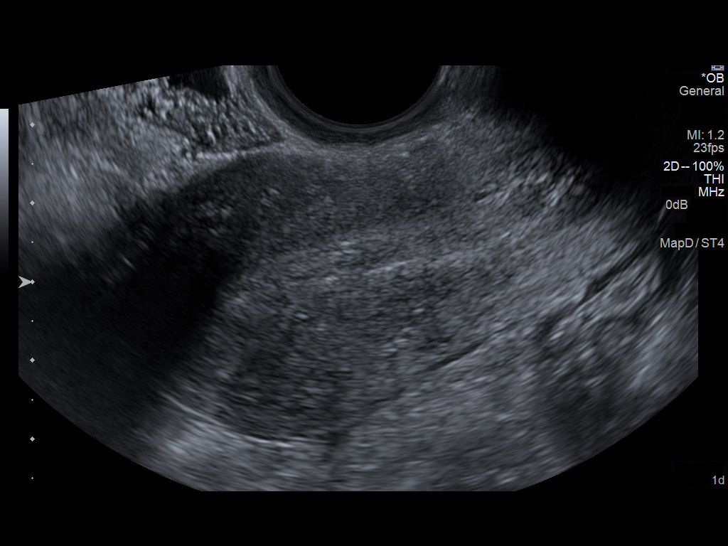

[14 of 28 positions shown; findings below may reference images not displayed]

FINDINGS: Intrauterine gestational sac: None

Yolk sac:  No

Embryo:  No

Cardiac Activity: No

Maternal uterus/adnexae: No uterine masses. Endometrium 12 mm in
thickness. Endometrium is mildly heterogeneous. No endometrial
fluid.

Normal ovaries.  No adnexal masses.  No pelvic free fluid.
IMPRESSION: 1. No evidence of an intrauterine or ectopic pregnancy.
2. Findings may be due to a completed miscarriage. An early
intrauterine pregnancy is possible. Ectopic pregnancy is not
excluded. Recommend follow-up beta HCG level. It beta HCG level is
increasing, repeat ultrasound would be indicated.
3. No uterine masses.  Normal ovaries and adnexa.

## 2017-01-07 ENCOUNTER — Ambulatory Visit (INDEPENDENT_AMBULATORY_CARE_PROVIDER_SITE_OTHER): Payer: BLUE CROSS/BLUE SHIELD | Admitting: Physician Assistant

## 2017-01-07 DIAGNOSIS — Z124 Encounter for screening for malignant neoplasm of cervix: Secondary | ICD-10-CM

## 2017-01-07 DIAGNOSIS — Z309 Encounter for contraceptive management, unspecified: Secondary | ICD-10-CM

## 2017-01-07 LAB — POCT URINE PREGNANCY: Preg Test, Ur: NEGATIVE

## 2017-01-07 MED ORDER — NORGESTIM-ETH ESTRAD TRIPHASIC 0.18/0.215/0.25 MG-35 MCG PO TABS: 1.0000 | ORAL_TABLET | Freq: Every day | ORAL | 11 refills | Status: DC

## 2017-01-07 NOTE — Progress Notes (Signed)
[facility] [address], [place] [address] [phone]  Date:  01/07/2017   Name:  Tonya Chavez   DOB:  06/07/1991   MRN:  [id number]  PCP:  No PCP Per Patient    History of Present Illness:  Tonya Chavez is a 26 y.o. female patient who [facility] cc of pap smear and birth control.  Patient is here for birth control.  She wishes for the pill at this time. No familial hx or personal hx of clotting disorder, malignancy, or smoking.   Sexually active.    Annual pap smear.  2 pregnancies, 1 abortion at 6018, miscarriage 2 years ago.   Hx of chlamydia.    No abnormal pap. She reports change in her vaginal discharge with using different soaps.   There are no active problems to display for this patient.   No past medical history on file.  No past surgical history on file.  Social History  Substance Use Topics  . Smoking status: Current Every Day Smoker  . Smokeless tobacco: Not on file  . Alcohol use Yes     Comment: occ    Family History  Problem Relation Age of Onset  . Hyperlipidemia Father   . Stroke Maternal Grandfather   . Cancer Paternal Grandmother   . Hyperlipidemia Paternal Grandmother     No Known Allergies  Medication list has been reviewed and updated.  Current Outpatient Prescriptions on File Prior to Visit  Medication Sig Dispense Refill  . azithromycin (ZITHROMAX) 250 MG tablet Take 4 tablets (1,000 mg total) by mouth once. Take 2 tabs PO x 1 dose, then 1 tab PO QD x 4 days 4 tablet 0  . docusate sodium (COLACE) 100 MG capsule Take 1 capsule (100 mg total) by mouth every 12 (twelve) hours. (Patient not taking: Reported on 04/28/2015) 60 capsule 0  . HYDROcodone-acetaminophen (NORCO/VICODIN) 5-325 MG per tablet Take 2 tablets by mouth every 4 (four) hours as needed. (Patient not taking: Reported on 04/28/2015) 10 tablet 0  . Ibuprofen 200 MG CAPS Take 400 mg by mouth daily as needed (pain). Reported on 12/07/2015    .  metroNIDAZOLE (FLAGYL) 500 MG tablet Take 1 tablet (500 mg total) by mouth 2 (two) times daily with a meal. DO NOT CONSUME ALCOHOL WHILE TAKING THIS MEDICATION. 14 tablet 0   No current facility-administered medications on file prior to visit.     ROS ROS otherwise unremarkable unless listed above.   Physical Examination: There were no vitals taken for this visit. Ideal Body Weight:    Physical Exam  Constitutional: She is oriented to person, place, and time. She appears well-developed and well-nourished. No distress.  HENT:  Head: Normocephalic and atraumatic.  Right Ear: External ear normal.  Left Ear: External ear normal.  Eyes: Conjunctivae and EOM are normal. Pupils are equal, round, and reactive to light.  Cardiovascular: Normal rate.   Pulmonary/Chest: Effort normal. No respiratory distress.  Genitourinary: Vagina normal and uterus normal. Pelvic exam was performed with patient supine. There is no rash or tenderness on the right labia. There is no rash or tenderness on the left labia. Cervix exhibits discharge (mild white discharge with bubbling.). Cervix exhibits no motion tenderness and no friability. Right adnexum displays no mass. Left adnexum displays no mass. No bleeding in the vagina. No signs of injury around the vagina. No vaginal discharge found.  Neurological: She is alert and oriented to person, place, and time.  Skin: She is not diaphoretic.  Psychiatric: She has a normal mood and affect. Her behavior is normal.    Assessment and Plan: Tonya Chavez is a 26 y.o. female who is here today for cc of pap smear. Encounter for contraceptive management, unspecified type - Plan: POCT urine pregnancy  Screening for cervical cancer - Plan: Pap IG, CT/NG w/ reflex HPV when ASC-U  Trena Platt, PA-C [facility] 1/21/20185:28 PM

## 2017-01-07 NOTE — Patient Instructions (Addendum)

## 2017-01-10 LAB — PAP IG, CT-NG, RFX HPV ASCU
Chlamydia, Nuc. Acid Amp: NEGATIVE
Gonococcus by Nucleic Acid Amp: NEGATIVE
PAP SMEAR COMMENT: 0

## 2017-12-26 ENCOUNTER — Other Ambulatory Visit: Payer: Self-pay

## 2017-12-26 ENCOUNTER — Emergency Department (HOSPITAL_COMMUNITY)
Admission: EM | Admit: 2017-12-26 | Discharge: 2017-12-26 | Disposition: A | Discharge status: Home / Self Care | Payer: 59 | Attending: Emergency Medicine | Admitting: Emergency Medicine

## 2017-12-26 ENCOUNTER — Encounter (HOSPITAL_COMMUNITY): Payer: Self-pay | Admitting: Emergency Medicine

## 2017-12-26 DIAGNOSIS — F17210 Nicotine dependence, cigarettes, uncomplicated: Secondary | ICD-10-CM | POA: Insufficient documentation

## 2017-12-26 DIAGNOSIS — M25512 Pain in left shoulder: Secondary | ICD-10-CM

## 2017-12-26 DIAGNOSIS — Z79899 Other long term (current) drug therapy: Secondary | ICD-10-CM | POA: Insufficient documentation

## 2017-12-26 MED ORDER — METHOCARBAMOL 500 MG PO TABS: 500.0000 mg | ORAL_TABLET | Freq: Two times a day (BID) | ORAL | 0 refills | Status: DC

## 2017-12-26 MED ORDER — PREDNISONE 20 MG PO TABS: 40.0000 mg | ORAL_TABLET | Freq: Every day | ORAL | 0 refills | Status: AC

## 2017-12-26 MED ORDER — METHOCARBAMOL 500 MG PO TABS
500.0000 mg | ORAL_TABLET | Freq: Once | ORAL | Status: AC
  Administered 2017-12-26: 500 mg via ORAL
  Filled 2017-12-26: qty 1

## 2017-12-26 NOTE — ED Provider Notes (Signed)
[facility]EMERG[facility]PT Provider Note   CSN: [id number] Arrival date & time: 12/26/17  4098     History   Chief Complaint Chief Complaint  Patient presents with  . Back Pain  . Neck Pain  . Arm Pain    HPI Tonya Chavez is a 27 y.o. female.  HPI   Patient is a 27 year old female with no significant past medical history presenting for left neck, shoulder, and arm pain for 3 days.  Patient reports that the pain began at work 3 days ago and was not associated with any heavy lifting or injury.  Patient works as a Naval architect.  Patient reports that the pain hurts with any movement, elevation of the left arm, deep breathing, or laying on her left side at night.  Patient describes the pain as "shooting".  Patient has tried Aleve and Advil without relief.   patient reports that she had paresthesias down her left arm yesterday, but denies muscular weakness of the left arm.  Patient denies any fever, chills, IVDU, or history of cancer.  Outpatient patient denies any unilateral leg swelling, recent immobilization, recent surgery, or hospitalization, OCP or other estrogen use, or personal or family history of DVT/PE.  Patient also denies vertigo or diplopia.  She was slightly lightheaded yesterday without vertigo.  Patient does have a history of a similar pain that occurred approximately 1 month ago, however patient reports the pain was self-limited.  Patient does not have a history of significant neck trauma.  History reviewed. No pertinent past medical history.  There are no active problems to display for this patient.   History reviewed. No pertinent surgical history.  OB History    Gravida Para Term Preterm AB Living   2       1 0   SAB TAB Ectopic Multiple Live Births   1               Home Medications    Prior to Admission medications   Medication Sig Start Date End Date Taking? Authorizing Provider  Ibuprofen 200 MG CAPS Take 400 mg by mouth daily  as needed (pain). Reported on 12/07/2015    [provider]  Norgestimate-Ethinyl Estradiol Triphasic 0.18/0.215/0.25 MG-35 MCG tablet Take 1 tablet by mouth daily. 01/07/17   Garnetta Buddy, PA    Family History Family History  Problem Relation Age of Onset  . Hyperlipidemia Father   . Stroke Maternal Grandfather   . Cancer Paternal Grandmother   . Hyperlipidemia Paternal Grandmother     Social History Social History   Tobacco Use  . Smoking status: Current Every Day Smoker  . Smokeless tobacco: Never Used  Substance Use Topics  . Alcohol use: Yes    Comment: occ  . Drug use: No     Allergies   Patient has no known allergies.   Review of Systems Review of Systems  Constitutional: Negative for chills and fever.  Respiratory: Negative for chest tightness and shortness of breath.   Cardiovascular: Negative for chest pain, palpitations and leg swelling.  Musculoskeletal: Positive for arthralgias, back pain, neck pain and neck stiffness.  Neurological: Positive for light-headedness. Negative for dizziness, weakness and numbness.     Physical Exam Updated Vital Signs BP 106/72   Pulse 78   Temp 98 F (36.7 C)   Resp 16   LMP 12/07/2017 (Exact Date)   SpO2 99%   Physical Exam  Constitutional: She appears well-developed and well-nourished. No distress.  Sitting comfortably in bed.  HENT:  Head: Normocephalic and atraumatic.  Eyes: Conjunctivae are normal. Right eye exhibits no discharge. Left eye exhibits no discharge.  EOMs normal to gross examination.  Neck: Normal range of motion.  Cardiovascular: Normal rate and regular rhythm.  Intact, 2+ radial pulse. No lower extremity edema.  Pulmonary/Chest: Effort normal and breath sounds normal. No respiratory distress.  Normal respiratory effort. Patient converses comfortably. No audible wheeze or stridor.  Abdominal: She exhibits no distension.  Musculoskeletal: Normal range of motion.  Range of  motion of the left shoulder is limited particular with abduction due to pain. C-Spine Exam: PALPATION: No midline but paraspinal musculature tenderness of cervical spine.  Patient is particularly point tender over the margins of the trapezius muscle on the left. ROM of cervical spine intact with flexion/extension/lateral flexion/lateral rotation; Patient can laterally rotate cervical spine greater than 45 degrees. Limitations in movement due to pain. MOTOR: 5/5 strength b/l with resisted shoulder abduction/adduction, biceps flexion (C5/6), biceps extension (C6-C8), wrist flexion, wrist extension (C6-C8), and grip strength (C7-T1) 2+ DTRs in the biceps and brachioradialis SENSORY: Sensation is intact to light touch and sharp in:  Superficial radial nerve distribution (dorsal first web space) Median nerve distribution (tip of index finger)   Ulnar nerve distribution (tip of small finger)   Neurological: She is alert.  Cranial nerves intact to gross observation. Patient moves extremities without difficulty.  Skin: Skin is warm and dry. She is not diaphoretic.  Psychiatric: She has a normal mood and affect. Her behavior is normal. Judgment and thought content normal.  Nursing note and vitals reviewed.    ED Treatments / Results  Labs (all labs ordered are listed, but only abnormal results are displayed) Labs Reviewed - No data to display  EKG  EKG Interpretation None       Radiology No results found.  Procedures Procedures (including critical care time)  Medications Ordered in ED Medications  methocarbamol (ROBAXIN) tablet 500 mg (500 mg Oral Given 12/26/17 1256)     Initial Impression / Assessment and Plan / ED Course  I have reviewed the triage vital signs and the nursing notes.  Pertinent labs & imaging results that were available during my care of the patient were reviewed by me and considered in my medical decision making (see chart for details).     Final Clinical  Impressions(s) / ED Diagnoses   Final diagnoses:  Acute pain of left shoulder   Patient is nontoxic-appearing and in no acute distress.   Oxygen saturation patientis  99% on room air.  Patient is not tachycardic.  I have low suspicion for pulmonary embolism at this time, is Wells is 0 and PERC score is 0.  Patient does smoke.  I discussed this with patient.  I also discussed pretest probability and testing for pulmonary embolism with patient.  Patient deferred this testing at this time, and she feels that this is in the musculature of the shoulder.  I discussed close follow-up and the reasons to return such as chest pain, shortness of breath, or palpitations.  I also have low suspicion for vertebral artery dissection, as patient symptoms are no vertiginous, and she has not had diplopia.  There is no antecedent event to suggest this pathology.  Patient symptoms are in the distribution of trapezius spasm versus cervical radiculopathy.  Will provide close follow-up with recommendation for [facility] urgent care, as well as orthopedic follow-up.  Patient is in understanding and agrees with the plan of  care.  This case was discussed with Dr. Sonia BallerMarcie Pfeiffer, who is in agreement with the plan of care.  ED Discharge Orders    None       Delia ChimesMurray, Abbiegail Landgren B, PA-C 12/26/17 1320    [address]Ojani Berenson B, [place]PA-C 12/26/17 1320    Arby BarrettePfeiffer, Marcy, MD 12/26/17 1731

## 2017-12-26 NOTE — Discharge Instructions (Signed)
Please see the information and instructions below regarding your visit.  Your diagnoses today include:  1. Acute pain of left shoulder    We will provide medications to assist with your pain.   I would like you to follow-up closely if your symptoms are not improving.  You may return to the ED, or have a close follow-up with [facility] that I listed on this paperwork.  Tests performed today include: See side panel of your discharge paperwork for testing performed today. Vital signs are listed at the bottom of these instructions.   Your vital signs look good today.  Medications prescribed:    Take any prescribed medications only as prescribed, and any over the counter medications only as directed on the packaging.  You are prescribed Robaxin, a muscle relaxant. Some common side effects of this medication include:  Feeling sleepy.  Dizziness. Take care upon going from a seated to a standing position.  Dry mouth.  Feeling tired or weak.  Hard stools (constipation).  Upset stomach. These are not all of the side effects that may occur. If you have questions about side effects, call your doctor. Call your primary care provider for medical advice about side effects.  This medication can be sedating. Only take this medication as needed. Please do not combine with alcohol. Do not drive or operate machinery while taking this medication.   This medication can interact with some other medications. Make sure to tell any provider you are taking this medication before they prescribe you a new medication.   You are prescribed prednisone, a steroid in the Ed. This is a medication to help reduce inflammation in the musculature and spine.  Common side effects include upset stomach/nausea. You may take this medicine with food if this occurs. Other side effects include restlessness, difficulty sleeping, and increased sweating. Call your healthcare provider if these do not resolve after finishing the  medication.  This medicine may increase your blood sugar so additional careful monitoring is needed of blood sugar if you have diabetes. Call your healthcare provider for any signs/symtpoms of high blood sugar such as confusion, feeling sleepy, more thirst, more hunger, passing urine more often, flushing, fast breathing, or breath that smells like fruit.   Home care instructions:  Please follow any educational materials contained in this packet.   Please follow the exercises in this packet.  I also recommended salon Pas patches to place over the muscles that are bothersome to you.  Follow-up instructions: Please follow-up with your primary care provider in as soon as possible for further evaluation of your symptoms if they are not completely improved.   Please follow up with [facility] for a recheck.  Return instructions:  Please return to the Emergency Department if you experience worsening symptoms.  Please return to the emergency department if you have any chest pain, increasing shortness of breath, palpitations, fevers, chills, or muscular weakness in your left arm. Please return if you have any other emergent concerns.  Additional Information:   Your vital signs today were: BP 106/72    Pulse 78    Temp 98 F (36.7 C)    Resp 16    LMP 12/07/2017 (Exact Date)    SpO2 99%  If your blood pressure (BP) was elevated on multiple readings during this visit above 130 for the top number or above 80 for the bottom number, please have this repeated by your primary care provider within one month. --------------  Thank you  for allowing us to participate in your care today.

## 2017-12-26 NOTE — ED Notes (Signed)
Bed: WTR8 Expected date:  Expected time:  Means of arrival:  Comments: 

## 2017-12-26 NOTE — ED Triage Notes (Signed)
Pt c/o intermittent pain to L neck, back and L arm. Pt states pain is not relieved with Aleve or Advil. Pt denies injury.

## 2018-03-30 ENCOUNTER — Encounter (HOSPITAL_BASED_OUTPATIENT_CLINIC_OR_DEPARTMENT_OTHER): Payer: Self-pay | Admitting: *Deleted

## 2018-03-30 ENCOUNTER — Emergency Department (HOSPITAL_BASED_OUTPATIENT_CLINIC_OR_DEPARTMENT_OTHER)
Admission: EM | Admit: 2018-03-30 | Discharge: 2018-03-30 | Disposition: A | Discharge status: Home / Self Care | Payer: Self-pay | Attending: Emergency Medicine | Admitting: Emergency Medicine

## 2018-03-30 ENCOUNTER — Other Ambulatory Visit: Payer: Self-pay

## 2018-03-30 DIAGNOSIS — M25512 Pain in left shoulder: Secondary | ICD-10-CM | POA: Insufficient documentation

## 2018-03-30 DIAGNOSIS — R197 Diarrhea, unspecified: Secondary | ICD-10-CM | POA: Insufficient documentation

## 2018-03-30 DIAGNOSIS — R112 Nausea with vomiting, unspecified: Secondary | ICD-10-CM | POA: Insufficient documentation

## 2018-03-30 DIAGNOSIS — R05 Cough: Secondary | ICD-10-CM | POA: Insufficient documentation

## 2018-03-30 DIAGNOSIS — G8929 Other chronic pain: Secondary | ICD-10-CM | POA: Insufficient documentation

## 2018-03-30 DIAGNOSIS — Z79899 Other long term (current) drug therapy: Secondary | ICD-10-CM | POA: Insufficient documentation

## 2018-03-30 DIAGNOSIS — E876 Hypokalemia: Secondary | ICD-10-CM | POA: Insufficient documentation

## 2018-03-30 DIAGNOSIS — F17200 Nicotine dependence, unspecified, uncomplicated: Secondary | ICD-10-CM | POA: Insufficient documentation

## 2018-03-30 LAB — BASIC METABOLIC PANEL
Anion gap: 8 (ref 5–15)
BUN: 9 mg/dL (ref 6–20)
CHLORIDE: 107 mmol/L (ref 101–111)
CO2: 24 mmol/L (ref 22–32)
CREATININE: 0.65 mg/dL (ref 0.44–1.00)
Calcium: 8.9 mg/dL (ref 8.9–10.3)
GFR calc non Af Amer: >60 mL/min (ref >60)
Glucose, Bld: 87 mg/dL (ref 65–99)
Potassium: 3.4 mmol/L — ABNORMAL LOW (ref 3.5–5.1)
Sodium: 139 mmol/L (ref 135–145)

## 2018-03-30 LAB — PREGNANCY, URINE: Preg Test, Ur: NEGATIVE

## 2018-03-30 MED ORDER — POTASSIUM CHLORIDE CRYS ER 20 MEQ PO TBCR
40.0000 meq | EXTENDED_RELEASE_TABLET | Freq: Once | ORAL | Status: AC
  Administered 2018-03-30: 40 meq via ORAL
  Filled 2018-03-30: qty 2

## 2018-03-30 NOTE — ED Notes (Signed)
ED Provider at bedside. 

## 2018-03-30 NOTE — ED Triage Notes (Signed)
Abdominal pain, body aches, vomiting and diarrhea x 5 days. [facility] and had a positive flu A&B swab.

## 2018-03-30 NOTE — ED Provider Notes (Signed)
[facility] EMERGENCY DEPARTMENT Provider Note   CSN: [id number] Arrival date & time: 03/30/18  1252     History   Chief Complaint Chief Complaint  Patient presents with  . Abdominal Pain  . Emesis  . Diarrhea    HPI Tonya Chavez is a 27 y.o. female.  Complains of vomiting and diarrhea that 5 days ago.  Maximum temperature 99.2 degrees.  She went to an urgent care center 4 days ago was diagnosed with influenza.  She admits to minimal cough.  No shortness of breath.  No headache.  No sore throat.  Nauseated presently feels she feels much improved today over yesterday.  She is presently hungry.  Last normal menstrual period isnow.  Been treated with Tamiflu with relief no other associated symptoms  HPI  History reviewed. No pertinent past medical history.  There are no active problems to display for this patient.   History reviewed. No pertinent surgical history.   OB History    Gravida  2   Para      Term      Preterm      AB  1   Living  0     SAB  1   TAB      Ectopic      Multiple      Live Births               Home Medications    Prior to Admission medications   Medication Sig Start Date End Date Taking? Authorizing Provider  Ibuprofen 200 MG CAPS Take 400 mg by mouth daily as needed (pain). Reported on 12/07/2015    [provider]  methocarbamol (ROBAXIN) 500 MG tablet Take 1 tablet (500 mg total) by mouth 2 (two) times daily. 12/26/17   Aviva KluverMurray, Alyssa B, PA-C  Norgestimate-Ethinyl Estradiol Triphasic 0.18/0.215/0.25 MG-35 MCG tablet Take 1 tablet by mouth daily. 01/07/17   Garnetta BuddyEnglish, Stephanie D, PA    Family History Family History  Problem Relation Age of Onset  . Hyperlipidemia Father   . Stroke Maternal Grandfather   . Cancer Paternal Grandmother   . Hyperlipidemia Paternal Grandmother     Social History Social History   Tobacco Use  . Smoking status: Current Every Day Smoker  . Smokeless tobacco: Never Used    Substance Use Topics  . Alcohol use: Yes    Comment: occ  . Drug use: No     Allergies   Patient has no known allergies.   Review of Systems Review of Systems  Respiratory: Positive for cough.   Gastrointestinal: Positive for diarrhea and vomiting.  Musculoskeletal: Positive for arthralgias.       Chronic left shoulder pain  All other systems reviewed and are negative.    Physical Exam Updated Vital Signs BP 112/76   Pulse 87   Temp 98.2 F (36.8 C) (Oral)   Resp 20   Ht 5\' 8"  (1.727 m)   Wt 80.3 kg (177 lb)   LMP 03/27/2018   SpO2 100%   BMI 26.91 kg/m   Physical Exam  Constitutional: She appears well-developed and well-nourished. No distress.  HENT:  Head: Normocephalic and atraumatic.  Eyes: Pupils are equal, round, and reactive to light. Conjunctivae are normal.  Neck: Neck supple. No tracheal deviation present. No thyromegaly present.  Cardiovascular: Normal rate and regular rhythm.  No murmur heard. Pulmonary/Chest: Effort normal and breath sounds normal.  Abdominal: Soft. Bowel sounds are normal. She exhibits no distension. There is  no tenderness.  Musculoskeletal: Normal range of motion. She exhibits no edema or tenderness.  Remedies without redness swelling or tenderness neurovascularly intact  Neurological: She is alert. Coordination normal.  Skin: Skin is warm and dry. No rash noted.  Psychiatric: She has a normal mood and affect.  Nursing note and vitals reviewed.    ED Treatments / Results  Labs (all labs ordered are listed, but only abnormal results are displayed) Labs Reviewed  BASIC METABOLIC PANEL  PREGNANCY, URINE   Results for orders placed or performed during the hospital encounter of 03/30/18  Basic metabolic panel  Result Value Ref Range   Sodium 139 135 - 145 mmol/L   Potassium 3.4 (L) 3.5 - 5.1 mmol/L   Chloride 107 101 - 111 mmol/L   CO2 24 22 - 32 mmol/L   Glucose, Bld 87 65 - 99 mg/dL   BUN 9 6 - 20 mg/dL   Creatinine,  Ser 1.61 0.44 - 1.00 mg/dL   Calcium 8.9 8.9 - 09.6 mg/dL   GFR calc non Af Amer >60 >60 mL/min   GFR calc Af Amer >60 >60 mL/min   Anion gap 8 5 - 15  Pregnancy, urine  Result Value Ref Range   Preg Test, Ur NEGATIVE NEGATIVE   No results found. EKG None  Radiology No results found.  Procedures Procedures (including critical care time)  Medications Ordered in ED Medications - No data to display   Initial Impression / Assessment and Plan / ED Course  I have reviewed the triage vital signs and the nursing notes.  Pertinent labs & imaging results that were available during my care of the patient were reviewed by me and considered in my medical decision making (see chart for details).     4:50 PM patient states "I feel fine" Potassium chloride 40 meq orally ordered to discharge as lab work consistent with mild hypokalemia. Plan encourage oral hydration.  Avoid dairy.  Patient declines prescription for antiemetic.  Imodium for diarrhea.  Referral primary care.  She will also be referred to Dr.Hudnall chronic left shoulder pain  Final Clinical Impressions(s) / ED Diagnoses  Dx #1 nausea vomiting diarrhea #2 hypokalemia #3 chronic left shoulder pain Final diagnoses:  None    ED Discharge Orders    None       Doug Sou, MD 03/30/18 1658

## 2018-03-30 NOTE — Discharge Instructions (Addendum)
Avoid milk or foods containing milk such as cheese or ice cream while having diarrhea.  Use Imodium as directed for diarrhea. Make sure that you drink at least six 8 ounce glasses of water or Gatorade each day in order to stay well-hydrated.  Call the number on these instructions to get a primary care physician.  Call Dr. Pearletha ForgeHudnall to schedule an office visit regarding her shoulder pain.  It is okay to take naproxen or tramadol but your formally prescribed as needed for shoulder pain

## 2019-01-19 ENCOUNTER — Encounter (HOSPITAL_COMMUNITY): Payer: Self-pay | Admitting: *Deleted

## 2019-01-19 ENCOUNTER — Inpatient Hospital Stay (HOSPITAL_COMMUNITY)
Admission: AD | Admit: 2019-01-19 | Discharge: 2019-01-19 | Discharge status: Against Medical Advice | Payer: Self-pay | Attending: Family Medicine | Admitting: Family Medicine

## 2019-01-19 ENCOUNTER — Other Ambulatory Visit: Payer: Self-pay

## 2019-01-19 DIAGNOSIS — N939 Abnormal uterine and vaginal bleeding, unspecified: Secondary | ICD-10-CM | POA: Insufficient documentation

## 2019-01-19 DIAGNOSIS — Z5321 Procedure and treatment not carried out due to patient leaving prior to being seen by health care provider: Secondary | ICD-10-CM | POA: Insufficient documentation

## 2019-01-19 LAB — POCT PREGNANCY, URINE: PREG TEST UR: NEGATIVE

## 2019-01-19 LAB — URINALYSIS, ROUTINE W REFLEX MICROSCOPIC
Bilirubin Urine: NEGATIVE
Glucose, UA: NEGATIVE mg/dL
Hgb urine dipstick: NEGATIVE
KETONES UR: NEGATIVE mg/dL
LEUKOCYTES UA: NEGATIVE
NITRITE: NEGATIVE
Protein, ur: NEGATIVE mg/dL
Specific Gravity, Urine: 1.027 (ref 1.005–1.030)
pH: 6 (ref 5.0–8.0)

## 2019-01-19 NOTE — MAU Note (Signed)
Pt called for a third time; not in lobby.

## 2019-01-19 NOTE — MAU Note (Signed)
Pt called for second time, not in lobby.

## 2019-01-19 NOTE — OB Triage Note (Signed)
Patient not in waiting room when called

## 2019-01-19 NOTE — MAU Note (Signed)
Pt took two HPT's three weeks ago, was equivocal.  Then started bleeding, was heavy, thought it was her period, lasted 4 days.  Started bleeding again yesterday, is now spotting.  Was having RLQ pain yesterday, none today.

## 2019-04-16 ENCOUNTER — Ambulatory Visit (HOSPITAL_COMMUNITY)
Admission: EM | Admit: 2019-04-16 | Discharge: 2019-04-16 | Disposition: A | Discharge status: Home / Self Care | Payer: Self-pay | Attending: Emergency Medicine | Admitting: Emergency Medicine

## 2019-04-16 ENCOUNTER — Encounter (HOSPITAL_COMMUNITY): Payer: Self-pay | Admitting: Emergency Medicine

## 2019-04-16 ENCOUNTER — Other Ambulatory Visit: Payer: Self-pay

## 2019-04-16 DIAGNOSIS — S61411A Laceration without foreign body of right hand, initial encounter: Secondary | ICD-10-CM

## 2019-04-16 NOTE — ED Triage Notes (Signed)
Pt presents to [facility] for laceration to right hand caused while slicing vegetables.

## 2019-04-16 NOTE — ED Provider Notes (Signed)
[facility]    CSN: [id number] Arrival date & time: 04/16/19  1449     History   Chief Complaint Chief Complaint  Patient presents with  . Laceration    HPI Tonya Chavez is a 28 y.o. female.   Tonya Chavez presents with complaints of laceration to palm of right hand immediately prior to arrival. She was slicing vegetables on a what sounds like was a mandoline slicer, when she accidentally cut her hand. She is right handed. Didn't cleanse the wound prior to arrival. Tdap UT in the past 5 years. Bleeding controlled. Mild pain. No numbness tingling or weakness to fingers. No other injuries.    ROS per HPI, negative if not otherwise mentioned.      History reviewed. No pertinent past medical history.  There are no active problems to display for this patient.   History reviewed. No pertinent surgical history.  OB History    Gravida  2   Para      Term      Preterm      AB  1   Living  0     SAB  1   TAB      Ectopic      Multiple      Live Births               Home Medications    Prior to Admission medications   Medication Sig Start Date End Date Taking? Authorizing Provider  Ibuprofen 200 MG CAPS Take 400 mg by mouth daily as needed (pain). Reported on 12/07/2015    [provider]  methocarbamol (ROBAXIN) 500 MG tablet Take 1 tablet (500 mg total) by mouth 2 (two) times daily. 12/26/17   Aviva KluverMurray, Alyssa B, PA-C  Norgestimate-Ethinyl Estradiol Triphasic 0.18/0.215/0.25 MG-35 MCG tablet Take 1 tablet by mouth daily. 01/07/17   Garnetta BuddyEnglish, Stephanie D, PA    Family History Family History  Problem Relation Age of Onset  . Hyperlipidemia Father   . Stroke Maternal Grandfather   . Cancer Paternal Grandmother   . Hyperlipidemia Paternal Grandmother     Social History Social History   Tobacco Use  . Smoking status: Former Games developermoker  . Smokeless tobacco: Never Used  Substance Use Topics  . Alcohol use: Yes    Comment: occ  .  Drug use: No     Allergies   Patient has no known allergies.   Review of Systems Review of Systems   Physical Exam Triage Vital Signs ED Triage Vitals [04/16/19 1508]  Enc Vitals Group     BP 129/71     Pulse Rate 99     Resp 18     Temp 98.8 F (37.1 C)     Temp Source Oral     SpO2 100 %     Weight      Height      Head Circumference      Peak Flow      Pain Score 10     Pain Loc      Pain Edu?      Excl. in GC?    No data found.  Updated Vital Signs BP 129/71 (BP Location: Left Arm)   Pulse 99   Temp 98.8 F (37.1 C) (Oral)   Resp 18   LMP 04/01/2019   SpO2 100%   Physical Exam Constitutional:      General: She is not in acute distress.    Appearance: She is well-developed.  Cardiovascular:     Rate and Rhythm: Normal rate and regular rhythm.     Heart sounds: Normal heart sounds.  Pulmonary:     Effort: Pulmonary effort is normal.     Breath sounds: Normal breath sounds.     Comments: Patient with rapid bleeding during examination related to anxiety  Musculoskeletal:     Right hand: She exhibits tenderness and laceration. She exhibits normal range of motion, no bony tenderness, normal two-point discrimination, normal capillary refill, no deformity and no swelling. Normal sensation noted. Normal strength noted.       Hands:     Comments: Approximately 4 cm in length skin flap from slicing mechanism of injury; closed and well approximated; no active bleeding; observed through ROM; cap refill < 2 seconds ; full sensation to all fingers and moving fingers without difficulty  Skin:    General: Skin is warm and dry.  Neurological:     Mental Status: She is alert and oriented to person, place, and time.      UC Treatments / Results  Labs (all labs ordered are listed, but only abnormal results are displayed) Labs Reviewed - No data to display  EKG None  Radiology No results found.  Procedures Procedures (including critical care time)   Medications Ordered in UC Medications - No data to display  Initial Impression / Assessment and Plan / UC Course  I have reviewed the triage vital signs and the nursing notes.  Pertinent labs & imaging results that were available during my care of the patient were reviewed by me and considered in my medical decision making (see chart for details).     Skin flap related to slicer injury to palm of hand. Well approximated and closing already. Wound care provided. Not a wound which requires suture closure. Tight dressing provided to apply pressure to skin flap. Wound care discussed. Return precautions provided. Patient verbalized understanding and agreeable to plan.   Final Clinical Impressions(s) / UC Diagnoses   Final diagnoses:  Laceration of right hand without foreign body, initial encounter     Discharge Instructions     This wound does not need sutures.  Apply pressure and try to immobilize/ limit the mobility of your hand as much as you can to allow for healing of the wound.  Cleanse daily with soap and water.  Keep covered to keep clean.  May take 1-2 weeks for healing to take place.  If develop increased redness, pain, swelling or pus drainage (signs of infection) please return to be seen    ED Prescriptions    None     Controlled Substance Prescriptions [place] Controlled Substance Registry consulted? Not Applicable   Georgetta Haber, NP 04/16/19 1850

## 2019-04-16 NOTE — Discharge Instructions (Signed)
This wound does not need sutures.  Apply pressure and try to immobilize/ limit the mobility of your hand as much as you can to allow for healing of the wound.  Cleanse daily with soap and water.  Keep covered to keep clean.  May take 1-2 weeks for healing to take place.  If develop increased redness, pain, swelling or pus drainage (signs of infection) please return to be seen

## 2020-08-29 ENCOUNTER — Encounter: Payer: Self-pay | Admitting: General Practice

## 2020-08-29 ENCOUNTER — Other Ambulatory Visit: Payer: Self-pay

## 2020-08-29 ENCOUNTER — Ambulatory Visit (INDEPENDENT_AMBULATORY_CARE_PROVIDER_SITE_OTHER): Payer: Self-pay | Admitting: *Deleted

## 2020-08-29 VITALS — BP 109/65 | HR 83 | Temp 97.8°F | Ht 68.0 in | Wt 188.2 lb

## 2020-08-29 DIAGNOSIS — Z3201 Encounter for pregnancy test, result positive: Secondary | ICD-10-CM

## 2020-08-29 DIAGNOSIS — Z3480 Encounter for supervision of other normal pregnancy, unspecified trimester: Secondary | ICD-10-CM | POA: Insufficient documentation

## 2020-08-29 LAB — POCT URINE PREGNANCY: Preg Test, Ur: POSITIVE — AB

## 2020-08-29 NOTE — Progress Notes (Addendum)
   PRENATAL INTAKE SUMMARY  Tonya Chavez presents today New OB Nurse Interview.  OB History    Gravida  3   Para      Term      Preterm      AB  2   Living  0     SAB  1   TAB  1   Ectopic      Multiple      Live Births  0          I have reviewed the patient's medical, obstetrical, social, and family histories, medications, and available lab results.  SUBJECTIVE She has no unusual complaints  OBJECTIVE Initial nurse interview for history (New OB)  Tonya: 04/16/21 by LMP GA: [redacted]w[redacted]d [id number]  GENERAL APPEARANCE: alert, well appearing, in no apparent distress, oriented to person, place and time   ASSESSMENT Positive UPT Normal pregnancy  PLAN Prenatal care-[facility] to be drawn 10/04/20 with Tonya Chavez, Tonya Chavez Patient to sign up for Mychart and Babyscripts.  Clovis Pu, RN

## 2020-08-29 NOTE — Patient Instructions (Addendum)
First Trimester of Pregnancy  The first trimester of pregnancy is from week 1 until the end of week 13 (months 1 through 3). During this time, your baby will begin to develop inside you. At 6-8 weeks, the eyes and face are formed, and the heartbeat can be seen on ultrasound. At the end of 12 weeks, all the baby's organs are formed. Prenatal care is all the medical care you receive before the birth of your baby. Make sure you get good prenatal care and follow all of your doctor's instructions. Follow these instructions at home: Medicines  Take over-the-counter and prescription medicines only as told by your doctor. Some medicines are safe and some medicines are not safe during pregnancy.  Take a prenatal vitamin that contains at least 600 micrograms (mcg) of folic acid.  If you have trouble pooping (constipation), take medicine that will make your stool soft (stool softener) if your doctor approves. Eating and drinking   Eat regular, healthy meals.  Your doctor will tell you the amount of weight gain that is right for you.  Avoid raw meat and uncooked cheese.  If you feel sick to your stomach (nauseous) or throw up (vomit): ? Eat 4 or 5 small meals a day instead of 3 large meals. ? Try eating a few soda crackers. ? Drink liquids between meals instead of during meals.  To prevent constipation: ? Eat foods that are high in fiber, like fresh fruits and vegetables, whole grains, and beans. ? Drink enough fluids to keep your pee (urine) clear or pale yellow. Activity  Exercise only as told by your doctor. Stop exercising if you have cramps or pain in your lower belly (abdomen) or low back.  Do not exercise if it is too hot, too humid, or if you are in a place of great height (high altitude).  Try to avoid standing for long periods of time. Move your legs often if you must stand in one place for a long time.  Avoid heavy lifting.  Wear low-heeled shoes. Sit and stand up  straight.  You can have sex unless your doctor tells you not to. Relieving pain and discomfort  Wear a good support bra if your breasts are sore.  Take warm water baths (sitz baths) to soothe pain or discomfort caused by hemorrhoids. Use hemorrhoid cream if your doctor says it is okay.  Rest with your legs raised if you have leg cramps or low back pain.  If you have puffy, bulging veins (varicose veins) in your legs: ? Wear support hose or compression stockings as told by your doctor. ? Raise (elevate) your feet for 15 minutes, 3-4 times a day. ? Limit salt in your food. Prenatal care  Schedule your prenatal visits by the twelfth week of pregnancy.  Write down your questions. Take them to your prenatal visits.  Keep all your prenatal visits as told by your doctor. This is important. Safety  Wear your seat belt at all times when driving.  Make a list of emergency phone numbers. The list should include numbers for family, friends, the hospital, and police and fire departments. General instructions  Ask your doctor for a referral to a local prenatal class. Begin classes no later than at the start of month 6 of your pregnancy.  Ask for help if you need counseling or if you need help with nutrition. Your doctor can give you advice or tell you where to go for help.  Do not use hot tubs, steam   tubs, steam rooms, or saunas.  Do not douche or use tampons or scented sanitary pads.  Do not cross your legs for long periods of time.  Avoid all herbs and alcohol. Avoid drugs that are not approved by your doctor.  Do not use any tobacco products, including cigarettes, chewing tobacco, and electronic cigarettes. If you need help quitting, ask your doctor. You may get counseling or other support to help you quit.  Avoid cat litter boxes and soil used by cats. These carry germs that can cause birth defects in the baby and can cause a loss of your baby (miscarriage) or stillbirth.  Visit your dentist.  At home, brush your teeth with a soft toothbrush. Be gentle when you floss. Contact a doctor if:  You are dizzy.  You have mild cramps or pressure in your lower belly.  You have a nagging pain in your belly area.  You continue to feel sick to your stomach, you throw up, or you have watery poop (diarrhea).  You have a bad smelling fluid coming from your vagina.  You have pain when you pee (urinate).  You have increased puffiness (swelling) in your face, hands, legs, or ankles. Get help right away if:  You have a fever.  You are leaking fluid from your vagina.  You have spotting or bleeding from your vagina.  You have very bad belly cramping or pain.  You gain or lose weight rapidly.  You throw up blood. It may look like coffee grounds.  You are around people who have [place] measles, fifth disease, or chickenpox.  You have a very bad headache.  You have shortness of breath.  You have any kind of trauma, such as from a fall or a car accident. Summary  The first trimester of pregnancy is from week 1 until the end of week 13 (months 1 through 3).  To take care of yourself and your unborn baby, you will need to eat healthy meals, take medicines only if your doctor tells you to do so, and do activities that are safe for you and your baby.  Keep all follow-up visits as told by your doctor. This is important as your doctor will have to ensure that your baby is healthy and growing well. This information is not intended to replace advice given to you by your health care provider. Make sure you discuss any questions you have with your health care provider. Document Revised: 04/01/2019 Document Reviewed: 12/17/2016 Elsevier Patient Education  2020 [employer].  Warning Signs During Pregnancy A pregnancy lasts about 40 weeks, starting from the first day of your last period until the baby is born. Pregnancy is divided into three phases called trimesters.  The first trimester  refers to week 1 through week 13 of pregnancy.  The second trimester is the start of week 14 through the end of week 27.  The third trimester is the start of week 28 until you deliver your baby. During each trimester of pregnancy, certain signs and symptoms may indicate a problem. Talk with your health care provider about your current health and any medical conditions you have. Make sure you know the symptoms that you should watch for and report. How does this affect me?  Warning signs in the first trimester While some changes during the first trimester may be uncomfortable, most do not represent a serious problem. Let your health care provider know if you have any of the following warning signs in the first trimester:  You cannot eat or drink without vomiting, and this lasts for longer than a day.  You have vaginal bleeding or spotting along with menstrual-like cramping.  You have diarrhea for longer than a day.  You have a fever or other signs of infection, such as: ? Pain or burning when you urinate. ? Foul smelling or thick or yellowish vaginal discharge. Warning signs in the second trimester As your baby grows and changes during the second trimester, there are additional signs and symptoms that may indicate a problem. These include:  Signs and symptoms of infection, including a fever.  Signs or symptoms of a miscarriage or preterm labor, such as regular contractions, menstrual-like cramping, or lower abdominal pain.  Bloody or watery vaginal discharge or obvious vaginal bleeding.  Feeling like your heart is pounding.  Having trouble breathing.  Nausea, vomiting, or diarrhea that lasts for longer than a day.  Craving non-food items, such as clay, chalk, or dirt. This may be a sign of a very treatable medical condition called pica. Later in your second trimester, watch for signs and symptoms of a serious medical condition called preeclampsia.These include:  Changes in your  vision.  A severe headache that does not go away.  Nausea and vomiting. It is also important to notice if your baby stops moving or moves less than usual during this time. Warning signs in the third trimester As you approach the third trimester, your baby is growing and your body is preparing for the birth of your baby. In your third trimester, be sure to let your health care provider know if:  You have signs and symptoms of infection, including a fever.  You have vaginal bleeding.  You notice that your baby is moving less than usual or is not moving.  You have nausea, vomiting, or diarrhea that lasts for longer than a day.  You have a severe headache that does not go away.  You have vision changes, including seeing spots or having blurry or double vision.  You have increased swelling in your hands or face. How does this affect my baby? Throughout your pregnancy, always report any of the warning signs of a problem to your health care provider. This can help prevent complications that may affect your baby, including:  Increased risk for premature birth.  Infection that may be transmitted to your baby.  Increased risk for stillbirth. Contact a health care provider if:  You have any of the warning signs of a problem for the current trimester of your pregnancy.  Any of the following apply to you during any trimester of pregnancy: ? You have strong emotions, such as sadness or anxiety, that interfere with work or personal relationships. ? You feel unsafe in your home and need help finding a safe place to live. ? You are using tobacco products, alcohol, or drugs and you need help to stop. Get help right away if: You have signs or symptoms of labor before 37 weeks of pregnancy. These include:  Contractions that are 5 minutes or less apart, or that increase in frequency, intensity, or length.  Sudden, sharp abdominal pain or low back pain.  Uncontrolled gush or trickle of fluid  from your vagina. Summary  A pregnancy lasts about 40 weeks, starting from the first day of your last period until the baby is born. Pregnancy is divided into three phases called trimesters. Each trimester has warning signs to watch for.  Always report any warning signs to your health care provider in  order to prevent complications that may affect both you and your baby.  Talk with your health care provider about your current health and any medical conditions you have. Make sure you know the symptoms that you should watch for and report. This information is not intended to replace advice given to you by your health care provider. Make sure you discuss any questions you have with your health care provider. Document Revised: 03/30/2019 Document Reviewed: 09/25/2017 Elsevier Patient Education  [place].  How to Take Your Blood Pressure You can take your blood pressure at home with a machine. You may need to check your blood pressure at home:  To check if you have high blood pressure (hypertension).  To check your blood pressure over time.  To make sure your blood pressure medicine is working. Supplies needed: You will need a blood pressure machine, or monitor. You can buy one at a drugstore or online. When choosing one:  Choose one with an arm cuff.  Choose one that wraps around your upper arm. Only one finger should fit between your arm and the cuff.  Do not choose one that measures your blood pressure from your wrist or finger. Your doctor can suggest a monitor. How to prepare Avoid these things for 30 minutes before checking your blood pressure:  Drinking caffeine.  Drinking alcohol.  Eating.  Smoking.  Exercising. Five minutes before checking your blood pressure:  Pee.  Sit in a dining chair. Avoid sitting in a soft couch or armchair.  Be quiet. Do not talk. How to take your blood pressure Follow the instructions that came with your machine. If you have a  digital blood pressure monitor, these may be the instructions: 1. Sit up straight. 2. Place your feet on the floor. Do not cross your ankles or legs. 3. Rest your left arm at the level of your heart. You may rest it on a table, desk, or chair. 4. Pull up your shirt sleeve. 5. Wrap the blood pressure cuff around the upper part of your left arm. The cuff should be 1 inch (2.5 cm) above your elbow. It is best to wrap the cuff around bare skin. 6. Fit the cuff snugly around your arm. You should be able to place only one finger between the cuff and your arm. 7. Put the cord inside the groove of your elbow. 8. Press the power button. 9. Sit quietly while the cuff fills with air and loses air. 10. Write down the numbers on the screen. 11. Wait 2-3 minutes and then repeat steps 1-10. What do the numbers mean? Two numbers make up your blood pressure. The first number is called systolic pressure. The second is called diastolic pressure. An example of a blood pressure reading is "120 over 80" (or 120/80). If you are an adult and do not have a medical condition, use this guide to find out if your blood pressure is normal: Normal  First number: below 120.  Second number: below 80. Elevated  First number: 120-129.  Second number: below 80. Hypertension stage 1  First number: 130-139.  Second number: 80-89. Hypertension stage 2  First number: 140 or above.  Second number: 68 or above. Your blood pressure is above normal even if only the top or bottom number is above normal. Follow these instructions at home:  Check your blood pressure as often as your doctor tells you to.  Take your monitor to your next doctor's appointment. Your doctor will: ? Make sure  you are using it correctly. ? Make sure it is working right.  Make sure you understand what your blood pressure numbers should be.  Tell your doctor if your medicines are causing side effects. Contact a doctor if:  Your blood  pressure keeps being high. Get help right away if:  Your first blood pressure number is higher than 180.  Your second blood pressure number is higher than 120. This information is not intended to replace advice given to you by your health care provider. Make sure you discuss any questions you have with your health care provider. Document Revised: 11/21/2017 Document Reviewed: 05/17/2016 Elsevier Patient Education  2020 Else[employer] Inc.  

## 2020-10-04 ENCOUNTER — Encounter: Payer: Self-pay | Admitting: Certified Nurse Midwife

## 2021-07-17 ENCOUNTER — Emergency Department (HOSPITAL_COMMUNITY)
Admission: EM | Admit: 2021-07-17 | Discharge: 2021-07-17 | Disposition: A | Discharge status: Home / Self Care | Payer: Worker's Compensation | Attending: Emergency Medicine | Admitting: Emergency Medicine

## 2021-07-17 ENCOUNTER — Other Ambulatory Visit: Payer: Self-pay

## 2021-07-17 ENCOUNTER — Emergency Department (HOSPITAL_COMMUNITY): Payer: Worker's Compensation

## 2021-07-17 ENCOUNTER — Encounter (HOSPITAL_COMMUNITY): Payer: Self-pay

## 2021-07-17 DIAGNOSIS — R112 Nausea with vomiting, unspecified: Secondary | ICD-10-CM | POA: Diagnosis not present

## 2021-07-17 DIAGNOSIS — R519 Headache, unspecified: Secondary | ICD-10-CM | POA: Diagnosis not present

## 2021-07-17 DIAGNOSIS — H53149 Visual discomfort, unspecified: Secondary | ICD-10-CM | POA: Insufficient documentation

## 2021-07-17 DIAGNOSIS — Z87891 Personal history of nicotine dependence: Secondary | ICD-10-CM | POA: Diagnosis not present

## 2021-07-17 DIAGNOSIS — M542 Cervicalgia: Secondary | ICD-10-CM | POA: Insufficient documentation

## 2021-07-17 MED ORDER — KETOROLAC TROMETHAMINE 30 MG/ML IJ SOLN
30.0000 mg | Freq: Once | INTRAMUSCULAR | Status: AC
  Administered 2021-07-17: 30 mg via INTRAMUSCULAR
  Filled 2021-07-17: qty 1

## 2021-07-17 MED ORDER — MELOXICAM 7.5 MG PO TABS: 7.5000 mg | ORAL_TABLET | Freq: Every day | ORAL | 0 refills | Status: AC

## 2021-07-17 NOTE — Discharge Instructions (Addendum)
Take the meloxicam twice daily with food and water for the next 5 days. Continue to stretch and use your neck As you normally would. If things change or worsen please return back to the ED for further evaluation. Please call, cannot help him on a Saturday do not already have a primary care physician to set up care with somebody outpatient for medical concerns as you need them.
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
## 2021-07-17 NOTE — ED Triage Notes (Signed)
Unable to sign, no sig pad
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
## 2024-08-31 ENCOUNTER — Encounter

## 2024-09-01 ENCOUNTER — Encounter: Payer: Self-pay | Admitting: *Deleted

## 2024-09-02 ENCOUNTER — Other Ambulatory Visit (HOSPITAL_COMMUNITY)
Admission: RE | Admit: 2024-09-02 | Discharge: 2024-09-02 | Disposition: A | Discharge status: Home / Self Care | Source: Ambulatory Visit | Attending: Obstetrics | Admitting: Obstetrics

## 2024-09-02 ENCOUNTER — Other Ambulatory Visit (INDEPENDENT_AMBULATORY_CARE_PROVIDER_SITE_OTHER): Payer: Self-pay

## 2024-09-02 ENCOUNTER — Ambulatory Visit

## 2024-09-02 VITALS — BP 126/73 | HR 78 | Wt 202.2 lb

## 2024-09-02 DIAGNOSIS — Z3480 Encounter for supervision of other normal pregnancy, unspecified trimester: Secondary | ICD-10-CM

## 2024-09-02 DIAGNOSIS — Z3481 Encounter for supervision of other normal pregnancy, first trimester: Secondary | ICD-10-CM | POA: Diagnosis not present

## 2024-09-02 DIAGNOSIS — Z3A12 12 weeks gestation of pregnancy: Secondary | ICD-10-CM | POA: Diagnosis not present

## 2024-09-02 DIAGNOSIS — O3680X0 Pregnancy with inconclusive fetal viability, not applicable or unspecified: Secondary | ICD-10-CM

## 2024-09-02 DIAGNOSIS — Z1332 Encounter for screening for maternal depression: Secondary | ICD-10-CM

## 2024-09-02 MED ORDER — FAMOTIDINE 20 MG PO TABS: 20.0000 mg | ORAL_TABLET | Freq: Two times a day (BID) | ORAL | 3 refills | Status: AC

## 2024-09-02 MED ORDER — BLOOD PRESSURE KIT DEVI: 1.0000 | 0 refills | Status: AC

## 2024-09-02 NOTE — Patient Instructions (Addendum)

## 2024-09-02 NOTE — Progress Notes (Signed)
 New OB Intake  I connected with Tonya Chavez  on 09/02/24 at  2:10 PM EDT by In Person Visit and verified that I am speaking with the correct person using two identifiers. Nurse is located at [facility] and pt is located at [place].  I discussed the limitations, risks, security and privacy concerns of performing an evaluation and management service by telephone and the availability of in person appointments. I also discussed with the patient that there may be a patient responsible charge related to this service. The patient expressed understanding and agreed to proceed.  I explained I am completing New OB Intake today. We discussed EDD of 03/12/25 based on LMP of 06/05/24. Pt is [id number]. I reviewed her allergies, medications and Medical/Surgical/OB history.    Patient Active Problem List   Diagnosis Date Noted   Supervision of other normal pregnancy, antepartum 08/29/2020     Concerns addressed today  Delivery Plans Plans to deliver at [facility]. Discussed the nature of our practice with multiple providers including residents and students as well as female and female providers. Due to the size of the practice, the delivering provider may not be the same as those providing prenatal care.   Patient is not interested in water birth.  MyChart/Babyscripts MyChart access verified. I explained pt will have some visits in office and some virtually. Babyscripts instructions given and order placed. Patient verifies receipt of registration text/e-mail. Account successfully created and app downloaded. If patient is a candidate for Optimized scheduling, add to sticky note.   Blood Pressure Cuff/Weight Scale Blood pressure cuff ordered for patient to pick-up from [employer]. Explained after first prenatal appt pt will check weekly and document in Babyscripts. Patient does not have weight scale; patient may purchase if they desire to track weight weekly in Babyscripts.  Anatomy US  Explained first  scheduled US  will be around 19 weeks. Anatomy US  scheduled for TBD at [facility].  Is patient a candidate for Babyscripts Optimization? Yes, patient accepted    First visit review I reviewed new OB appt with patient. Explained pt will be seen by Dr. Alger at first visit. Discussed [place] genetic screening with patient. Requests Panor[employer] and [employer].. Routine prenatal labs collected at today's visit.   Last Pap No results found for: DIAGPAP  Tonya [name], RN 09/02/2024  2:15 PM

## 2024-09-03 LAB — HEMOGLOBIN A1C
Est. average glucose Bld gHb Est-mCnc: 100 mg/dL
Hgb A1c MFr Bld: 5.1 % (ref 4.8–5.6)

## 2024-09-03 LAB — HCV INTERPRETATION

## 2024-09-03 LAB — CBC/D/PLT+RPR+RH+ABO+RUBIGG...
Antibody Screen: NEGATIVE
Basophils Absolute: 0.1 x10E3/uL (ref 0.0–0.2)
Basos: 1 %
EOS (ABSOLUTE): 0.3 x10E3/uL (ref 0.0–0.4)
Eos: 2 %
HCV Ab: NONREACTIVE
HIV Screen 4th Generation wRfx: NONREACTIVE
Hematocrit: 41.4 % (ref 34.0–46.6)
Hemoglobin: 13.9 g/dL (ref 11.1–15.9)
Hepatitis B Surface Ag: NEGATIVE
Immature Grans (Abs): 0.1 x10E3/uL (ref 0.0–0.1)
Immature Granulocytes: 1 %
Lymphocytes Absolute: 3.7 x10E3/uL — ABNORMAL HIGH (ref 0.7–3.1)
Lymphs: 24 %
MCH: 32.1 pg (ref 26.6–33.0)
MCHC: 33.6 g/dL (ref 31.5–35.7)
MCV: 96 fL (ref 79–97)
Monocytes Absolute: 1.6 x10E3/uL — ABNORMAL HIGH (ref 0.1–0.9)
Monocytes: 10 %
Neutrophils Absolute: 9.7 x10E3/uL — ABNORMAL HIGH (ref 1.4–7.0)
Neutrophils: 62 %
Platelets: 396 x10E3/uL (ref 150–450)
RBC: 4.33 x10E6/uL (ref 3.77–5.28)
RDW: 12.1 % (ref 11.7–15.4)
RPR Ser Ql: NONREACTIVE
Rh Factor: POSITIVE
Rubella Antibodies, IGG: <0.9 {index} — ABNORMAL LOW (ref >0.99)
WBC: 15.4 x10E3/uL — ABNORMAL HIGH (ref 3.4–10.8)

## 2024-09-04 LAB — CULTURE, OB URINE

## 2024-09-04 LAB — URINE CULTURE, OB REFLEX

## 2024-09-06 ENCOUNTER — Ambulatory Visit: Payer: Self-pay | Admitting: Obstetrics and Gynecology

## 2024-09-06 DIAGNOSIS — Z148 Genetic carrier of other disease: Secondary | ICD-10-CM

## 2024-09-06 DIAGNOSIS — Z3480 Encounter for supervision of other normal pregnancy, unspecified trimester: Secondary | ICD-10-CM

## 2024-09-06 LAB — CERVICOVAGINAL ANCILLARY ONLY
Chlamydia: NEGATIVE
Comment: NEGATIVE
Comment: NORMAL
Neisseria Gonorrhea: NEGATIVE

## 2024-09-09 LAB — PANORAMA PRENATAL TEST FULL PANEL:PANORAMA TEST PLUS 5 ADDITIONAL MICRODELETIONS: FETAL FRACTION: 4.4

## 2024-09-13 ENCOUNTER — Encounter: Admitting: Obstetrics and Gynecology

## 2024-09-13 DIAGNOSIS — Z3480 Encounter for supervision of other normal pregnancy, unspecified trimester: Secondary | ICD-10-CM

## 2024-09-13 DIAGNOSIS — Z148 Genetic carrier of other disease: Secondary | ICD-10-CM | POA: Insufficient documentation

## 2024-09-13 LAB — HORIZON CUSTOM: REPORT SUMMARY: POSITIVE — AB

## 2024-09-29 ENCOUNTER — Ambulatory Visit: Attending: Obstetrics and Gynecology

## 2024-10-11 ENCOUNTER — Telehealth: Payer: Self-pay

## 2024-10-11 ENCOUNTER — Encounter (HOSPITAL_BASED_OUTPATIENT_CLINIC_OR_DEPARTMENT_OTHER): Payer: Self-pay | Admitting: Emergency Medicine

## 2024-10-11 ENCOUNTER — Other Ambulatory Visit: Payer: Self-pay

## 2024-10-11 ENCOUNTER — Emergency Department (HOSPITAL_BASED_OUTPATIENT_CLINIC_OR_DEPARTMENT_OTHER)
Admission: EM | Admit: 2024-10-11 | Discharge: 2024-10-11 | Disposition: A | Discharge status: Home / Self Care | Attending: Emergency Medicine | Admitting: Emergency Medicine

## 2024-10-11 DIAGNOSIS — O26892 Other specified pregnancy related conditions, second trimester: Secondary | ICD-10-CM | POA: Insufficient documentation

## 2024-10-11 DIAGNOSIS — R1030 Lower abdominal pain, unspecified: Secondary | ICD-10-CM | POA: Diagnosis not present

## 2024-10-11 DIAGNOSIS — Z3A18 18 weeks gestation of pregnancy: Secondary | ICD-10-CM | POA: Insufficient documentation

## 2024-10-11 DIAGNOSIS — Y92410 Unspecified street and highway as the place of occurrence of the external cause: Secondary | ICD-10-CM | POA: Diagnosis not present

## 2024-10-11 NOTE — Discharge Instructions (Signed)
 Your exam is reassuring today.  Monitor for vaginal bleeding, severe pain or other concerns.  Follow-up with an OB/GYN doctor to be rechecked
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
## 2024-10-11 NOTE — ED Notes (Signed)
 ED Provider at bedside.

## 2024-10-27 NOTE — Telephone Encounter (Signed)
 NOB RN Talk Complete. Identity verified.  Daniela Swinford, RNBSN

## 2024-10-29 ENCOUNTER — Other Ambulatory Visit: Payer: Self-pay | Admitting: *Deleted

## 2024-10-29 ENCOUNTER — Ambulatory Visit

## 2024-10-29 ENCOUNTER — Other Ambulatory Visit

## 2024-10-29 DIAGNOSIS — Z3480 Encounter for supervision of other normal pregnancy, unspecified trimester: Secondary | ICD-10-CM

## 2024-11-28 ENCOUNTER — Other Ambulatory Visit: Payer: Self-pay | Admitting: Obstetrics and Gynecology
